# Patient Record
Sex: Female | Born: 1970 | Race: White | Hispanic: No | Smoking: Never smoker
Health system: Southern US, Community
[De-identification: ages and names within clinical notes are randomized; demographics above are authoritative.]

## PROBLEM LIST (undated history)

## (undated) DIAGNOSIS — E559 Vitamin D deficiency, unspecified: Secondary | ICD-10-CM

## (undated) DIAGNOSIS — I10 Essential (primary) hypertension: Secondary | ICD-10-CM

## (undated) DIAGNOSIS — Z9884 Bariatric surgery status: Secondary | ICD-10-CM

## (undated) DIAGNOSIS — D509 Iron deficiency anemia, unspecified: Secondary | ICD-10-CM

## (undated) DIAGNOSIS — F419 Anxiety disorder, unspecified: Secondary | ICD-10-CM

## (undated) DIAGNOSIS — R011 Cardiac murmur, unspecified: Secondary | ICD-10-CM

## (undated) DIAGNOSIS — I359 Nonrheumatic aortic valve disorder, unspecified: Secondary | ICD-10-CM

## (undated) DIAGNOSIS — I517 Cardiomegaly: Secondary | ICD-10-CM

## (undated) DIAGNOSIS — E669 Obesity, unspecified: Secondary | ICD-10-CM

## (undated) HISTORY — DX: Nonrheumatic aortic valve disorder, unspecified: I35.9

## (undated) HISTORY — DX: Vitamin D deficiency, unspecified: E55.9

## (undated) HISTORY — DX: Bariatric surgery status: Z98.84

## (undated) HISTORY — DX: Cardiac murmur, unspecified: R01.1

## (undated) HISTORY — DX: Essential (primary) hypertension: I10

## (undated) HISTORY — DX: Iron deficiency anemia, unspecified: D50.9

## (undated) HISTORY — DX: Cardiomegaly: I51.7

## (undated) HISTORY — DX: Obesity, unspecified: E66.9

## (undated) HISTORY — DX: Anxiety disorder, unspecified: F41.9

---

## 2008-03-03 ENCOUNTER — Ambulatory Visit: Payer: Self-pay | Admitting: Family Medicine

## 2008-03-03 DIAGNOSIS — I10 Essential (primary) hypertension: Secondary | ICD-10-CM | POA: Insufficient documentation

## 2008-03-03 DIAGNOSIS — S6390XA Sprain of unspecified part of unspecified wrist and hand, initial encounter: Secondary | ICD-10-CM | POA: Insufficient documentation

## 2008-03-03 DIAGNOSIS — S6990XA Unspecified injury of unspecified wrist, hand and finger(s), initial encounter: Secondary | ICD-10-CM | POA: Insufficient documentation

## 2010-01-26 HISTORY — PX: GASTRIC BYPASS: SHX52

## 2010-02-04 ENCOUNTER — Ambulatory Visit
Admission: RE | Admit: 2010-02-04 | Discharge: 2010-02-04 | Payer: Self-pay | Source: Home / Self Care | Admitting: Emergency Medicine

## 2010-02-04 ENCOUNTER — Encounter: Payer: Self-pay | Admitting: Emergency Medicine

## 2010-02-04 DIAGNOSIS — S139XXA Sprain of joints and ligaments of unspecified parts of neck, initial encounter: Secondary | ICD-10-CM | POA: Insufficient documentation

## 2010-02-10 ENCOUNTER — Telehealth (INDEPENDENT_AMBULATORY_CARE_PROVIDER_SITE_OTHER): Payer: Self-pay | Admitting: *Deleted

## 2010-02-27 NOTE — Progress Notes (Signed)
  Phone Note Outgoing Call Call back at [facility] Phone [phone]   Call placed by: Lajean Saver RN,  February 10, 2010 4:44 PM Call placed to: Patient Summary of Call: Callback: No answer. Message left with reason for call and call back with any questions or concerns

## 2010-02-27 NOTE — Assessment & Plan Note (Signed)
Summary: SEVERE NECK PAIN/NH   Vital Signs:  Patient Profile:   40 Years Old Female CC:      posterior neck pain x 2 1/2 weeks Height:     67.5 inches Weight:      293 pounds O2 Sat:      99 % O2 treatment:    Room Air Temp:     99.3 degrees F oral Pulse rate:   94 / minute Resp:     20 per minute BP sitting:   155 / 101  (left arm) Cuff size:   large  Pt. in pain?   yes    Location:   posterior neck    Intensity:   7    Type:       dull  Vitals Entered By: Lajean Saver RN (February 04, 2010 2:04 PM)                   Updated Prior Medication List: * HCTZ once a day ULTRACET 37.5-325 MG TABS (TRAMADOL-ACETAMINOPHEN) 1 by mouth q6 hrs as needed for pain FLEXERIL 10 MG TABS (CYCLOBENZAPRINE HCL) 1 by mouth three times a day as needed spasms / pain MULTIVITAMINS  TABS (MULTIPLE VITAMIN) once daily CALCIUM 500 MG TABS (CALCIUM)   Current Allergies: ! SULFAHistory of Present Illness History from: patient Chief Complaint: posterior neck pain x 2 1/2 weeks History of Present Illness: Posterior neck pain for 2 1/2 weeks.  Was moving a bed around the same time that this occurred and may have contributed.  7/10 dull spasms worse with movement.  No HA, visual changes, migraines, F/C/N/V.  Has been experiencing mild R elbow tingling as well but she is an administrative asst and types all day.  No history of neck or back pain.  She states that her office was hot today and that's why her temp is up.  Other than neck and HA, no other symptoms.  REVIEW OF SYSTEMS Constitutional Symptoms      Denies fever, chills, night sweats, weight loss, weight gain, and fatigue.  Eyes       Denies change in vision, eye pain, eye discharge, glasses, contact lenses, and eye surgery. Ear/Nose/Throat/Mouth       Denies hearing loss/aids, change in hearing, ear pain, ear discharge, dizziness, frequent runny nose, frequent nose bleeds, sinus problems, sore throat, hoarseness, and tooth pain or bleeding.   Respiratory       Denies dry cough, productive cough, wheezing, shortness of breath, asthma, bronchitis, and emphysema/COPD.  Cardiovascular       Denies murmurs, chest pain, and tires easily with exhertion.    Gastrointestinal       Denies stomach pain, nausea/vomiting, diarrhea, constipation, blood in bowel movements, and indigestion. Genitourniary       Denies painful urination, kidney stones, and loss of urinary control. Neurological       Complains of tingling.      Denies paralysis, seizures, and fainting/blackouts.      Comments: right elbow Musculoskeletal       Complains of muscle pain and decreased range of motion.      Denies joint pain, joint stiffness, redness, swelling, muscle weakness, and gout.      Comments: neck Skin       Denies bruising, unusual mles/lumps or sores, and hair/skin or nail changes.  Psych       Denies mood changes, temper/anger issues, anxiety/stress, speech problems, depression, and sleep problems. Other Comments: Patient c/o neck pain x 2  1/2 weeks. she c/o some tingling in her right elbow.    Past History:  Past Medical History: fluid problems fluid retention  Past Surgical History: Reviewed history from 03/03/2008 and no changes required. None  Family History: Family History Diabetes 1st degree relative Family History Hypertension mother alive with high blood pressure and diabetes father alive with heart problems brother ailve and healthy sister aive with high blood pressure and kidney CA  Social History: Never Smoked Alcohol use-yes - one drink per week Drug use-no Regular exercise-no Occupation: Advertising account planner Divorced Physical Exam General appearance: well developed, well nourished, no acute distress, pleasant Chest/Lungs: no rales, wheezes, or rhonchi bilateral, breath sounds equal without effort Heart: regular rate and  rhythm, no murmur Extremities: normal extremities Neurological: distal NV status of R arm is  normal Skin: no obvious rashes or lesions MSE: oriented to time, place, and person TTP trapezius upper (to occiput) and middle (to scapula) as well as paraspinal, worse on R.  ROM of neck is full.  Spurling negative.  No meningitic signs, PERRLA, EOMI Assessment New Problems: NECK SPRAIN AND STRAIN (ICD-847.0) CERVICAL MUSCLE STRAIN (ICD-847.0)   Plan New Medications/Changes: FLEXERIL 10 MG TABS (CYCLOBENZAPRINE HCL) 1 by mouth three times a day as needed spasms / pain  #18 x 0, 02/04/2010, Hoyt Koch MD ULTRACET 37.5-325 MG TABS (TRAMADOL-ACETAMINOPHEN) 1 by mouth q6 hrs as needed for pain  #20 x 0, 02/04/2010, Hoyt Koch MD  Planning Comments:   If she is not improving in 1-2 weeks, consider C-spine Xray to look for any OA.  Also will get her to see Physical therapy for stretching.  Advised her to return to her masseuse to see if that helps.  Rx given today for Ultracet and Flexeril.  Go buy a heating pad as well and work on gentle ROM.  If any further problems, fever, f/u with PCP sooner.   The patient and/or caregiver has been counseled thoroughly with regard to medications prescribed including dosage, schedule, interactions, rationale for use, and possible side effects and they verbalize understanding.  Diagnoses and expected course of recovery discussed and will return if not improved as expected or if the condition worsens. Patient and/or caregiver verbalized understanding.  Prescriptions: FLEXERIL 10 MG TABS (CYCLOBENZAPRINE HCL) 1 by mouth three times a day as needed spasms / pain  #18 x 0   Entered and Authorized by:   Hoyt Koch MD   Signed by:   Hoyt Koch MD on 02/04/2010   Method used:   Print then Give to Patient   RxID:   [id number] ULTRACET 37.5-325 MG TABS (TRAMADOL-ACETAMINOPHEN) 1 by mouth q6 hrs as needed for pain  #20 x 0   Entered and Authorized by:   Hoyt Koch MD   Signed by:   Hoyt Koch MD on 02/04/2010   Method  used:   Print then Give to Patient   RxID:   [id number]

## 2011-06-23 ENCOUNTER — Other Ambulatory Visit: Payer: Self-pay | Admitting: Obstetrics & Gynecology

## 2011-06-23 ENCOUNTER — Encounter: Payer: Self-pay | Admitting: Obstetrics & Gynecology

## 2011-06-23 ENCOUNTER — Ambulatory Visit (INDEPENDENT_AMBULATORY_CARE_PROVIDER_SITE_OTHER): Payer: 59 | Admitting: Obstetrics & Gynecology

## 2011-06-23 VITALS — BP 124/84 | HR 82 | Temp 98.0°F | Resp 16 | Ht 66.0 in | Wt 290.0 lb

## 2011-06-23 DIAGNOSIS — Z124 Encounter for screening for malignant neoplasm of cervix: Secondary | ICD-10-CM

## 2011-06-23 DIAGNOSIS — Z113 Encounter for screening for infections with a predominantly sexual mode of transmission: Secondary | ICD-10-CM

## 2011-06-23 DIAGNOSIS — Z1151 Encounter for screening for human papillomavirus (HPV): Secondary | ICD-10-CM

## 2011-06-23 DIAGNOSIS — Z0000 Encounter for general adult medical examination without abnormal findings: Secondary | ICD-10-CM

## 2011-06-23 DIAGNOSIS — Z01419 Encounter for gynecological examination (general) (routine) without abnormal findings: Secondary | ICD-10-CM

## 2011-06-23 MED ORDER — ETONOGESTREL-ETHINYL ESTRADIOL 0.12-0.015 MG/24HR VA RING: VAGINAL_RING | VAGINAL | Status: DC

## 2011-06-23 NOTE — Progress Notes (Signed)
Patient is here for routine exam, she is having heavy cycles and would like to start on birth control.  She is also having increased vaginal dryness and libido issues she would like to discuss.

## 2011-06-24 ENCOUNTER — Encounter: Payer: Self-pay | Admitting: Obstetrics & Gynecology

## 2011-06-24 NOTE — Progress Notes (Signed)
  Subjective:     Janet Wheeler is a 41 y.o. female here for a routine exam.  Current complaints: vaginal dryness / irritation; decreased libido.  Personal health questionnaire reviewed: yes.  Pt has decreased libido and wonders if it is related to gastric bypass.  Pt has new partner in past year and relationship is good.  Pt is more stressed at work.  Pt uses [place] but not replens for vaginal moisture.  [name] is also concerned about heavy menses.  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  Her menses is q 28 days and heavy for 3-4 days and lasts a total of 7.  Discussed OCPs vs Nuva Ring.  There is not good evidence about efficacy of OCPs after gastric bypass.  Most studies recommend barrier method after gastric bypass.  Nuva Ring would be a better option.   Gynecologic History Patient's last menstrual period was 05/31/2011. Contraception: condoms Last Pap: 2012. Results were: normal Last mammogram: Needs to be ordered  Obstetric History OB History    Grav Para Term Preterm Abortions TAB SAB Ect Mult Living   0 0 0 0 0 0 0 0 0 0        The following portions of the patient's history were reviewed and updated as appropriate: allergies, current medications, past family history, past medical history, past social history, past surgical history and problem list.  Review of Systems A comprehensive review of systems was negative except for: Genitourinary: positive for sexual problems, vaginal discharge and heavy menses    Objective:    Vitals:  WNL General appearance: alert, cooperative and no distress Head: Normocephalic, without obvious abnormality, atraumatic Eyes: negative Throat: lips, mucosa, and tongue normal; teeth and gums normal Lungs: clear to auscultation bilaterally Breasts: normal appearance, no masses or tenderness, No nipple retraction or dimpling, No nipple discharge or bleeding Heart: regular rate and rhythm Abdomen: soft, non-tender; bowel sounds normal; no masses,  no  organomegaly Pelvic: cervix normal in appearance, external genitalia normal, no adnexal masses or tenderness, no bladder tenderness, no cervical motion tenderness, perianal skin: no external genital warts noted, rectovaginal septum normal, urethra without abnormality or discharge, uterus normal size, shape, and consistency and vagina normal without discharge Extremities: no edema, redness or tenderness in the calves or thighs Skin: no lesions or rash Lymph nodes: Axillary adenopathy: none       Assessment:    Healthy female exam.    Plan:    Education reviewed: safe sex/STD prevention, self breast exams and pap. Contraception: condoms. Mammogram ordered. Follow up in: 1 year. pap smear, wet prep, GC/Chlam   Nuva Ring

## 2011-06-25 ENCOUNTER — Other Ambulatory Visit: Payer: Self-pay | Admitting: Obstetrics & Gynecology

## 2011-06-25 LAB — WET PREP, GENITAL: Yeast Wet Prep HPF POC: NONE SEEN

## 2011-06-25 MED ORDER — METRONIDAZOLE 0.75 % VA GEL: 1.0000 | Freq: Two times a day (BID) | VAGINAL | Status: AC

## 2011-06-26 NOTE — Progress Notes (Signed)
Pt notified of Wet Prep results and that a RX was sent to [facility] in [place].

## 2011-07-02 ENCOUNTER — Telehealth: Payer: Self-pay | Admitting: *Deleted

## 2011-07-02 NOTE — Telephone Encounter (Signed)
Message copied by Gerome Apley on Thu Jul 02, 2011 10:11 AM ------      Message from: Lesly Dukes      Created: Thu Jul 02, 2011  9:22 AM       Pap and HPV negative--paps q 3 years

## 2011-07-21 ENCOUNTER — Ambulatory Visit
Admission: RE | Admit: 2011-07-21 | Discharge: 2011-07-21 | Disposition: A | Discharge status: Home / Self Care | Payer: 59 | Source: Ambulatory Visit | Attending: Obstetrics & Gynecology | Admitting: Obstetrics & Gynecology

## 2011-07-21 DIAGNOSIS — Z01419 Encounter for gynecological examination (general) (routine) without abnormal findings: Secondary | ICD-10-CM

## 2011-09-01 ENCOUNTER — Encounter: Payer: Self-pay | Admitting: *Deleted

## 2011-09-01 ENCOUNTER — Emergency Department (INDEPENDENT_AMBULATORY_CARE_PROVIDER_SITE_OTHER): Payer: BC Managed Care – PPO

## 2011-09-01 ENCOUNTER — Emergency Department
Admission: EM | Admit: 2011-09-01 | Discharge: 2011-09-01 | Disposition: A | Discharge status: Home / Self Care | Payer: BC Managed Care – PPO | Source: Home / Self Care

## 2011-09-01 DIAGNOSIS — W19XXXA Unspecified fall, initial encounter: Secondary | ICD-10-CM

## 2011-09-01 DIAGNOSIS — M79609 Pain in unspecified limb: Secondary | ICD-10-CM

## 2011-09-01 DIAGNOSIS — M25529 Pain in unspecified elbow: Secondary | ICD-10-CM

## 2011-09-01 DIAGNOSIS — S56919A Strain of unspecified muscles, fascia and tendons at forearm level, unspecified arm, initial encounter: Secondary | ICD-10-CM

## 2011-09-01 DIAGNOSIS — IMO0002 Reserved for concepts with insufficient information to code with codable children: Secondary | ICD-10-CM

## 2011-09-01 NOTE — ED Provider Notes (Signed)
History     CSN: [id number]  Arrival date & time 09/01/11  1742   None     Chief Complaint  Patient presents with  . Arm Injury   HPI Arm pain;  Pt was coming downstairs, slipped.  Pt grabbed rail with L hand, weight of fall fell primarily on R hand. Incident occurred this am.  Pt has had persisent R forearm pain since this point.  No wrist or elbow pain.  Pain primarily on anterior forearm.  Pain primarily with grip and wrist rotation.  No distal numbness or paresthesias.   History reviewed. No pertinent past medical history.  Past Surgical History  Procedure Date  . Gastric bypass 2012    Family History  Problem Relation Age of Onset  . Diabetes Mother   . Heart disease Mother   . Heart disease Father   . Diabetes Father   . Cancer Father     prostate   . Heart disease Sister   . Cancer Sister     renal and thyroid cancer    History  Substance Use Topics  . Smoking status: Never Smoker   . Smokeless tobacco: Never Used  . Alcohol Use: Yes     wine occasionally/ once a week    OB History    Grav Para Term Preterm Abortions TAB SAB Ect Mult Living   0 0 0 0 0 0 0 0 0 0       Review of Systems  All other systems reviewed and are negative.    Allergies  Sulfonamide derivatives and Sulfur  Home Medications   Current Outpatient Rx  Name Route Sig Dispense Refill  . OMEPRAZOLE 20 MG PO CPDR Oral Take 20 mg by mouth daily.    Marland Kitchen VITAMIN B 12 PO Oral Take 1,000 mg by mouth.    . ETONOGESTREL-ETHINYL ESTRADIOL 0.12-0.015 MG/24HR VA RING  Insert vaginally and leave in place for 3 consecutive weeks, then remove for 1 week. 1 each 12  . MULTI-VITAMIN/MINERALS PO TABS Oral Take 1 tablet by mouth daily.    Marland Kitchen PANTOPRAZOLE SODIUM 40 MG PO TBEC      . VITAMIN D (ERGOCALCIFEROL) 50000 UNITS PO CAPS        BP 146/91  Pulse 58  Temp 99.1 F (37.3 C) (Oral)  Resp 16  Ht 5\' 6"  (1.676 m)  Wt 175 lb (79.379 kg)  BMI 28.25 kg/m2  SpO2 100%  LMP  08/30/2011  Physical Exam  Constitutional: She appears well-developed and well-nourished.  HENT:  Head: Normocephalic and atraumatic.  Eyes: Pupils are equal, round, and reactive to light.  Neck: Normal range of motion. Neck supple.  Cardiovascular: Normal rate and regular rhythm.   Pulmonary/Chest: Effort normal and breath sounds normal.  Abdominal: Soft.  Musculoskeletal:       Arms:   ED Course  Procedures (including critical care time)  Labs Reviewed - No data to display Dg Forearm Right  09/01/2011  *RADIOLOGY REPORT*  Clinical Data: Pain secondary to a fall today.  RIGHT FOREARM - 2 VIEW  Comparison: None.  Findings: There is no fracture, dislocation, or joint effusion. The patient has slight degenerative changes between the olecranon and the trochlea of the distal humerus.  IMPRESSION: No acute osseous abnormality.  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  Original Report Authenticated By: Gwynn Burly, M.D.     1. Forearm strain       MDM  Likely strain of pronators and extensors of  anterior forearm. Xrays negative for dislocation or fracture.  RICE, NSAIDs.  Discussed general care.  Handout given.  Follow up as needed.     The patient and/or caregiver has been counseled thoroughly with regard to treatment plan and/or medications prescribed including dosage, schedule, interactions, rationale for use, and possible side effects and they verbalize understanding. Diagnoses and expected course of recovery discussed and will return if not improved as expected or if the condition worsens. Patient and/or caregiver verbalized understanding.            Floydene Flock, MD 09/01/11 [id number]

## 2011-09-01 NOTE — ED Notes (Signed)
Pt c/o RT arm injury  X today, after falling on her steps at home. No OTC meds.

## 2011-09-02 NOTE — ED Provider Notes (Signed)
Agree with exam, assessment, and plan.   Janet Wheeler A Lenna Hagarty, MD 09/02/11 0952 

## 2012-05-31 ENCOUNTER — Other Ambulatory Visit: Payer: Self-pay | Admitting: *Deleted

## 2012-05-31 DIAGNOSIS — IMO0001 Reserved for inherently not codable concepts without codable children: Secondary | ICD-10-CM

## 2012-05-31 MED ORDER — ETONOGESTREL-ETHINYL ESTRADIOL 0.12-0.015 MG/24HR VA RING: VAGINAL_RING | VAGINAL | Status: DC

## 2012-05-31 NOTE — Telephone Encounter (Signed)
Rcvd refill request for Nuvaring - pt has not been seen by [facility] in about a year - refilled one month supply with note to pharm that pt needs appt before any further refills

## 2012-06-09 ENCOUNTER — Ambulatory Visit (INDEPENDENT_AMBULATORY_CARE_PROVIDER_SITE_OTHER): Payer: BC Managed Care – PPO | Admitting: Obstetrics & Gynecology

## 2012-06-09 ENCOUNTER — Encounter: Payer: Self-pay | Admitting: Obstetrics & Gynecology

## 2012-06-09 VITALS — BP 125/88 | HR 84 | Resp 16 | Ht 67.0 in | Wt 191.0 lb

## 2012-06-09 DIAGNOSIS — Z01419 Encounter for gynecological examination (general) (routine) without abnormal findings: Secondary | ICD-10-CM

## 2012-06-09 DIAGNOSIS — Z0000 Encounter for general adult medical examination without abnormal findings: Secondary | ICD-10-CM

## 2012-06-09 NOTE — Progress Notes (Signed)
  Subjective:     Janet Wheeler is a 42 y.o. female here for a routine exam.  Current complaints: none.  Doing well on Nuva Ring.  Vaginal dryness is better.  Pt planning a 360 abdominoplasty.  Pt also training for 1/2 marathon.  Personal health questionnaire reviewed: yes.   Gynecologic History Patient's last menstrual period was 05/01/2012. Contraception: NuvaRing vaginal inserts Last Pap: 2013. Results were: normal, HPV negative Last mammogram: 2013. Results were: normal  Obstetric History OB History   Grav Para Term Preterm Abortions TAB SAB Ect Mult Living   0 0 0 0 0 0 0 0 0 0        The following portions of the patient's history were reviewed and updated as appropriate: allergies, current medications, past family history, past medical history, past social history, past surgical history and problem list.  Review of Systems Pertinent items are noted in HPI.    Objective:   Filed Vitals:   06/09/12 1359  BP: 125/88  Pulse: 84  Resp: 16  Height: 5\' 7"  (1.702 m)  Weight: 191 lb (86.637 kg)      Vitals:  WNL General appearance: alert, cooperative and no distress Head: Normocephalic, without obvious abnormality, atraumatic Eyes: negative Throat: lips, mucosa, and tongue normal; teeth and gums normal Lungs: clear to auscultation bilaterally Breasts: normal appearance, no masses or tenderness, No nipple retraction or dimpling, No nipple discharge or bleeding Heart: regular rate and rhythm Abdomen: soft, non-tender; bowel sounds normal; no masses,  no organomegaly Pelvic: cervix normal in appearance, external genitalia normal, no adnexal masses or tenderness, no bladder tenderness, no cervical motion tenderness, perianal skin: no external genital warts noted, rectovaginal septum normal, urethra without abnormality or discharge, uterus normal size, shape, and consistency and vagina normal without discharge Extremities: no edema, redness or tenderness in the calves or  thighs Skin: no lesions or rash Lymph nodes: Axillary adenopathy: none        Assessment:    Healthy female exam.    Plan:    Education reviewed: self breast exams, skin cancer screening and weight bearing exercise. Contraception: NuvaRing vaginal inserts. Mammogram ordered. Follow up in: 1 year. Pap in 2016

## 2012-07-26 ENCOUNTER — Ambulatory Visit (INDEPENDENT_AMBULATORY_CARE_PROVIDER_SITE_OTHER): Payer: BC Managed Care – PPO

## 2012-07-26 ENCOUNTER — Other Ambulatory Visit: Payer: Self-pay | Admitting: *Deleted

## 2012-07-26 DIAGNOSIS — Z1231 Encounter for screening mammogram for malignant neoplasm of breast: Secondary | ICD-10-CM

## 2012-07-26 DIAGNOSIS — IMO0001 Reserved for inherently not codable concepts without codable children: Secondary | ICD-10-CM

## 2012-07-26 DIAGNOSIS — Z0000 Encounter for general adult medical examination without abnormal findings: Secondary | ICD-10-CM

## 2012-07-26 MED ORDER — ETONOGESTREL-ETHINYL ESTRADIOL 0.12-0.015 MG/24HR VA RING: VAGINAL_RING | VAGINAL | Status: DC

## 2012-07-26 NOTE — Telephone Encounter (Signed)
Pt called and stated that she never received her RF on Nuvaring last month.  Per Dr Bertram Denver note she gave her RF's x 1 year.  Pharmacy notified to [facility] RF.

## 2013-06-20 ENCOUNTER — Other Ambulatory Visit: Payer: Self-pay | Admitting: *Deleted

## 2013-06-20 DIAGNOSIS — IMO0001 Reserved for inherently not codable concepts without codable children: Secondary | ICD-10-CM

## 2013-06-20 MED ORDER — ETONOGESTREL-ETHINYL ESTRADIOL 0.12-0.015 MG/24HR VA RING: VAGINAL_RING | VAGINAL | Status: DC

## 2013-07-04 ENCOUNTER — Ambulatory Visit (INDEPENDENT_AMBULATORY_CARE_PROVIDER_SITE_OTHER): Payer: BC Managed Care – PPO | Admitting: Obstetrics & Gynecology

## 2013-07-04 ENCOUNTER — Encounter: Payer: Self-pay | Admitting: Obstetrics & Gynecology

## 2013-07-04 VITALS — BP 128/80 | HR 75 | Resp 16 | Ht 67.0 in | Wt 192.0 lb

## 2013-07-04 DIAGNOSIS — Z309 Encounter for contraceptive management, unspecified: Secondary | ICD-10-CM

## 2013-07-04 DIAGNOSIS — Z01419 Encounter for gynecological examination (general) (routine) without abnormal findings: Secondary | ICD-10-CM

## 2013-07-04 DIAGNOSIS — IMO0001 Reserved for inherently not codable concepts without codable children: Secondary | ICD-10-CM

## 2013-07-04 MED ORDER — ETONOGESTREL-ETHINYL ESTRADIOL 0.12-0.015 MG/24HR VA RING: VAGINAL_RING | VAGINAL | Status: DC

## 2013-07-04 NOTE — Progress Notes (Signed)
  Subjective:     Janet Wheeler is a 43 y.o. female here for a routine exam.  Current complaints: none; planning on breast augmentation and 360 abdominoplasty this winter (near Christmas).  Needs ammogram.  Personal health questionnaire reviewed: yes.   Gynecologic History Patient's last menstrual period was 06/22/2013. Contraception: NuvaRing vaginal inserts Last Pap: 2013. Results were: normal (negative cotesting) Last mammogram: 2014. Results were: normal  Obstetric History OB History  Gravida Para Term Preterm AB SAB TAB Ectopic Multiple Living  0 0 0 0 0 0 0 0 0 0          The following portions of the patient's history were reviewed and updated as appropriate: allergies, current medications, past family history, past medical history, past social history, past surgical history and problem list.  Review of Systems Pertinent items are noted in HPI.    Objective:      Filed Vitals:   07/04/13 1415  BP: 128/80  Pulse: 75  Resp: 16  Height: 5\' 7"  (1.702 m)  Weight: 192 lb (87.091 kg)   Vitals:  WNL General appearance: alert, cooperative and no distress Head: Normocephalic, without obvious abnormality, atraumatic Eyes: negative Throat: lips, mucosa, and tongue normal; teeth and gums normal Lungs: clear to auscultation bilaterally Breasts: normal appearance, no masses or tenderness, No nipple retraction or dimpling, No nipple discharge or bleeding Heart: regular rate and rhythm Abdomen: soft, non-tender; bowel sounds normal; no masses,  no organomegaly  Pelvic:  External Genitalia:  Tanner V, no lesion Urethra:  No prolapse Vagina:  Pink, normal rugae, no blood or discharge Cervix:  No CMT, no lesion Uterus:  Normal size and contour, non tender Adnexa:  Normal, no masses, non tender Rectovaginal Septum:  Non tender, no masses  Extremities: no edema, redness or tenderness in the calves or thighs Skin: no lesions or rash Lymph nodes: Axillary adenopathy: none        Assessment:    Healthy female exam.    Plan:    Education reviewed: safe sex/STD prevention, self breast exams and weight bearing exercise. Contraception: NuvaRing vaginal inserts. Mammogram ordered. Follow up in: 1 year. Pap due next year.

## 2013-07-27 ENCOUNTER — Other Ambulatory Visit: Payer: Self-pay | Admitting: Obstetrics & Gynecology

## 2013-07-27 ENCOUNTER — Ambulatory Visit (INDEPENDENT_AMBULATORY_CARE_PROVIDER_SITE_OTHER): Payer: BC Managed Care – PPO

## 2013-07-27 DIAGNOSIS — Z1231 Encounter for screening mammogram for malignant neoplasm of breast: Secondary | ICD-10-CM

## 2014-06-26 ENCOUNTER — Other Ambulatory Visit: Payer: Self-pay | Admitting: Obstetrics & Gynecology

## 2014-06-26 DIAGNOSIS — Z1231 Encounter for screening mammogram for malignant neoplasm of breast: Secondary | ICD-10-CM

## 2014-07-05 ENCOUNTER — Other Ambulatory Visit: Payer: Self-pay | Admitting: Obstetrics & Gynecology

## 2014-07-05 NOTE — Telephone Encounter (Signed)
Patient needs to come in for annual exam.

## 2014-07-26 ENCOUNTER — Ambulatory Visit (INDEPENDENT_AMBULATORY_CARE_PROVIDER_SITE_OTHER): Payer: 59 | Admitting: Obstetrics & Gynecology

## 2014-07-26 ENCOUNTER — Encounter: Payer: Self-pay | Admitting: Obstetrics & Gynecology

## 2014-07-26 VITALS — BP 129/88 | HR 68 | Ht 66.0 in | Wt 193.0 lb

## 2014-07-26 DIAGNOSIS — Z01419 Encounter for gynecological examination (general) (routine) without abnormal findings: Secondary | ICD-10-CM

## 2014-07-26 DIAGNOSIS — Z124 Encounter for screening for malignant neoplasm of cervix: Secondary | ICD-10-CM

## 2014-07-26 DIAGNOSIS — Z139 Encounter for screening, unspecified: Secondary | ICD-10-CM

## 2014-07-26 DIAGNOSIS — Z3049 Encounter for surveillance of other contraceptives: Secondary | ICD-10-CM | POA: Diagnosis not present

## 2014-07-26 DIAGNOSIS — Z1151 Encounter for screening for human papillomavirus (HPV): Secondary | ICD-10-CM | POA: Diagnosis not present

## 2014-07-26 LAB — COMPREHENSIVE METABOLIC PANEL
ALT: <8 U/L (ref 0–35)
AST: 12 U/L (ref 0–37)
Albumin: 3.7 g/dL (ref 3.5–5.2)
Alkaline Phosphatase: 36 U/L — ABNORMAL LOW (ref 39–117)
BUN: 10 mg/dL (ref 6–23)
CO2: 20 mEq/L (ref 19–32)
CREATININE: 0.74 mg/dL (ref 0.50–1.10)
Calcium: 8.6 mg/dL (ref 8.4–10.5)
Chloride: 109 mEq/L (ref 96–112)
GLUCOSE: 88 mg/dL (ref 70–99)
POTASSIUM: 3.7 meq/L (ref 3.5–5.3)
SODIUM: 141 meq/L (ref 135–145)
Total Bilirubin: 0.3 mg/dL (ref 0.2–1.2)
Total Protein: 6.1 g/dL (ref 6.0–8.3)

## 2014-07-26 LAB — FERRITIN: Ferritin: 3 ng/mL — ABNORMAL LOW (ref 10–291)

## 2014-07-26 LAB — VITAMIN B12: VITAMIN B 12: 888 pg/mL (ref 211–911)

## 2014-07-26 LAB — CBC
HCT: 31 % — ABNORMAL LOW (ref 36.0–46.0)
Hemoglobin: 9.2 g/dL — ABNORMAL LOW (ref 12.0–15.0)
MCH: 19.8 pg — AB (ref 26.0–34.0)
MCHC: 29.7 g/dL — AB (ref 30.0–36.0)
MCV: 66.8 fL — AB (ref 78.0–100.0)
MPV: 9.8 fL (ref 8.6–12.4)
PLATELETS: 402 10*3/uL — AB (ref 150–400)
RBC: 4.64 MIL/uL (ref 3.87–5.11)
RDW: 17 % — ABNORMAL HIGH (ref 11.5–15.5)
WBC: 7.7 10*3/uL (ref 4.0–10.5)

## 2014-07-26 LAB — LIPID PANEL
CHOL/HDL RATIO: 2.8 ratio
Cholesterol: 170 mg/dL (ref 0–200)
HDL: 61 mg/dL (ref >=46)
LDL CALC: 80 mg/dL (ref 0–99)
TRIGLYCERIDES: 145 mg/dL (ref <150)
VLDL: 29 mg/dL (ref 0–40)

## 2014-07-26 LAB — TSH: TSH: 1.686 u[IU]/mL (ref 0.350–4.500)

## 2014-07-26 MED ORDER — ETONOGESTREL-ETHINYL ESTRADIOL 0.12-0.015 MG/24HR VA RING: VAGINAL_RING | VAGINAL | Status: DC

## 2014-07-26 NOTE — Progress Notes (Signed)
  Subjective:     Janet Wheeler is a 44 y.o. female here for a routine exam.  Current complaints: none.  She did not get the plastic surgery done but continues to work out.  Gynecologic History Patient's last menstrual period was 07/07/2014. Contraception: NuvaRing vaginal inserts Last Pap: 2013. Results were: normal Last mammogram: 2015. Results were: normal  Obstetric History OB History  Gravida Para Term Preterm AB SAB TAB Ectopic Multiple Living  0 0 0 0 0 0 0 0 0 0          The following portions of the patient's history were reviewed and updated as appropriate: allergies, current medications, past family history, past medical history, past social history, past surgical history and problem list.  Review of Systems Pertinent items are noted in HPI.   ROs is negative.   Objective:      Filed Vitals:   07/26/14 0826  BP: 129/88  Pulse: 68  Height: 5\' 6"  (1.676 m)  Weight: 193 lb (87.544 kg)   Vitals:  WNL General appearance: alert, cooperative and no distress Head: Normocephalic, without obvious abnormality, atraumatic Eyes: negative Throat: lips, mucosa, and tongue normal; teeth and gums normal Lungs: clear to auscultation bilaterally Breasts: normal appearance, no masses or tenderness, No nipple retraction or dimpling, No nipple discharge or bleeding Heart: regular rate and rhythm Abdomen: soft, non-tender; bowel sounds normal; no masses,  no organomegaly  Pelvic:  External Genitalia:  Tanner V, no lesion Urethra:  No prolpase Vagina:  Pink, normal rugae, no blood or discharge Cervix:  No CMT, no lesion Uterus:  Normal size and contour, non tender Adnexa:  Normal, no masses, non tender  Extremities: no edema, redness or tenderness in the calves or thighs Skin: no lesions or rash Lymph nodes: Axillary adenopathy: none         Assessment:    Healthy female exam.    Plan:    Education reviewed: self breast exams and skin cancer  screening. Contraception: NuvaRing vaginal inserts. Mammogram ordered. Follow up in: 1 year.

## 2014-07-27 ENCOUNTER — Telehealth: Payer: Self-pay

## 2014-07-27 ENCOUNTER — Encounter: Payer: Self-pay | Admitting: Obstetrics & Gynecology

## 2014-07-27 DIAGNOSIS — D509 Iron deficiency anemia, unspecified: Secondary | ICD-10-CM

## 2014-07-27 DIAGNOSIS — E559 Vitamin D deficiency, unspecified: Secondary | ICD-10-CM

## 2014-07-27 HISTORY — DX: Vitamin D deficiency, unspecified: E55.9

## 2014-07-27 HISTORY — DX: Iron deficiency anemia, unspecified: D50.9

## 2014-07-27 LAB — VITAMIN D 25 HYDROXY (VIT D DEFICIENCY, FRACTURES): Vit D, 25-Hydroxy: 29 ng/mL — ABNORMAL LOW (ref 30–100)

## 2014-07-27 LAB — CYTOLOGY - PAP

## 2014-07-27 NOTE — Telephone Encounter (Signed)
Patient called and made aware of results of vitamin D and Ferritin are low. Patient states she will go onto my chart and print off the records of the labs and take them with her to both her PCP and surgeon appointment. Armandina StammerJennifer Howard RN BSN

## 2014-07-27 NOTE — Telephone Encounter (Signed)
-----   Message from Lesly DukesKelly H Leggett, MD sent at 07/27/2014  6:22 AM EDT ----- Vit D and ferritin are low (patient has iron def anemia).  She needs to f/u with her PCP or bypass surgeon for supplementation.  We drew these labs for her in anticipation of her upcoming visit with her surgeon.  Please make sure she knows the values and is getting the appropriate follow up.

## 2014-08-01 LAB — FOLATE RBC

## 2014-08-15 ENCOUNTER — Ambulatory Visit: Payer: Self-pay

## 2014-08-24 ENCOUNTER — Other Ambulatory Visit: Payer: Self-pay | Admitting: Obstetrics & Gynecology

## 2014-08-24 NOTE — Telephone Encounter (Signed)
12 refills given

## 2014-08-30 ENCOUNTER — Ambulatory Visit: Payer: Self-pay

## 2014-09-05 ENCOUNTER — Ambulatory Visit (INDEPENDENT_AMBULATORY_CARE_PROVIDER_SITE_OTHER): Payer: 59

## 2014-09-05 DIAGNOSIS — Z1231 Encounter for screening mammogram for malignant neoplasm of breast: Secondary | ICD-10-CM | POA: Diagnosis not present

## 2015-09-03 ENCOUNTER — Other Ambulatory Visit: Payer: Self-pay

## 2015-09-03 ENCOUNTER — Telehealth: Payer: Self-pay

## 2015-09-03 DIAGNOSIS — Z3040 Encounter for surveillance of contraceptives, unspecified: Secondary | ICD-10-CM

## 2015-09-03 MED ORDER — ETONOGESTREL-ETHINYL ESTRADIOL 0.12-0.015 MG/24HR VA RING: 1.0000 | VAGINAL_RING | VAGINAL | 0 refills | Status: DC

## 2015-09-03 NOTE — Telephone Encounter (Signed)
Refill Nuvaring.

## 2015-09-03 NOTE — Telephone Encounter (Signed)
Refill request for Nuvaring, sent to pharmacy. Pt has an appointment with Dr.Leggett for annual.

## 2015-09-19 ENCOUNTER — Ambulatory Visit (INDEPENDENT_AMBULATORY_CARE_PROVIDER_SITE_OTHER): Payer: No Typology Code available for payment source | Admitting: Obstetrics & Gynecology

## 2015-09-19 ENCOUNTER — Encounter: Payer: Self-pay | Admitting: Obstetrics & Gynecology

## 2015-09-19 VITALS — BP 142/92 | HR 71 | Ht 65.0 in | Wt 198.0 lb

## 2015-09-19 DIAGNOSIS — Z01419 Encounter for gynecological examination (general) (routine) without abnormal findings: Secondary | ICD-10-CM

## 2015-09-20 NOTE — Progress Notes (Signed)
Subjective:     Janet FontanaJulia Wheeler is a 45 y.o. female here for a routine exam.  Current complaints: bleeding (light spotting) during week three of the nuva ring.  Pt takes nuva ring out, then starts heavier flow 2-3 days later.  Otherwise no other bleeding .     Gynecologic History Patient's last menstrual period was 09/08/2015 (exact date). Contraception: NuvaRing vaginal inserts Last Pap: 06/2014. Results were: normal Last mammogram: 2016. Results were: normal  Obstetric History OB History  Gravida Para Term Preterm AB Living  0 0 0 0 0 0  SAB TAB Ectopic Multiple Live Births  0 0 0 0           The following portions of the patient's history were reviewed and updated as appropriate: allergies, current medications, past family history, past medical history, past social history, past surgical history and problem list.  Review of Systems Pertinent items noted in HPI and remainder of comprehensive ROS otherwise negative.    Objective:      Vitals:   09/19/15 1308  BP: (!) 142/92  Pulse: 71  Weight: 198 lb (89.8 kg)  Height: 5\' 5"  (1.651 m)   Vitals:  WNL General appearance: alert, cooperative and no distress  HEENT: Normocephalic, without obvious abnormality, atraumatic Eyes: negative Throat: lips, mucosa, and tongue normal; teeth and gums normal  Respiratory: Clear to auscultation bilaterally  CV: Regular rate and rhythm  Breasts:  Normal appearance, no masses or tenderness, no nipple retraction or dimpling  GI: Soft, non-tender; bowel sounds normal; no masses,  no organomegaly  GU: External Genitalia:  Tanner V, no lesion Urethra:  No prolapse   Vagina: Pink, normal rugae, no blood or discharge  Cervix: No CMT, no lesion  Uterus:  Normal size and contour, non tender  Adnexa: Normal, no masses, non tender  Musculoskeletal: No edema, redness or tenderness in the calves or thighs  Skin: No lesions or rash  Lymphatic: Axillary adenopathy: none    Psychiatric: Normal mood  and behavior        Assessment:    Healthy female exam.    Plan:    Contraception: NuvaRing vaginal inserts. Mammogram ordered. Follow up in: 3 months.     BP mildly high.  Will recheck at next visit.   Pt to keep Nuva ring in during spotting and report back what happens.  If spotting continues, can change to another method.  If HTN remains, pt not candidate for methods with estrogen.   Pt opts for mammogram every other year Pap is q3 yrs

## 2015-10-01 ENCOUNTER — Telehealth: Payer: Self-pay | Admitting: *Deleted

## 2015-10-01 NOTE — Telephone Encounter (Signed)
Spoke with patient concerning her slightly elevated BP @ her last visit here.  She had nurse @ her work check her BP and reported it to be 130/72.  Pt states that she was in a hurry the day she saw Dr Penne LashLeggett and felt like that was the reason her BP was elevated.

## 2015-10-14 ENCOUNTER — Telehealth: Payer: Self-pay | Admitting: *Deleted

## 2015-10-14 DIAGNOSIS — Z3040 Encounter for surveillance of contraceptives, unspecified: Secondary | ICD-10-CM

## 2015-10-14 MED ORDER — ETONOGESTREL-ETHINYL ESTRADIOL 0.12-0.015 MG/24HR VA RING: 1.0000 | VAGINAL_RING | VAGINAL | 6 refills | Status: DC

## 2015-10-14 NOTE — Telephone Encounter (Signed)
RF for Nuvaring sent to [facility].  Pt was seen for well woman 8/17

## 2016-06-03 ENCOUNTER — Ambulatory Visit (INDEPENDENT_AMBULATORY_CARE_PROVIDER_SITE_OTHER): Payer: 59 | Admitting: Physician Assistant

## 2016-06-03 ENCOUNTER — Encounter: Payer: Self-pay | Admitting: Physician Assistant

## 2016-06-03 VITALS — BP 149/90 | HR 73 | Ht 65.0 in | Wt 205.0 lb

## 2016-06-03 DIAGNOSIS — F4321 Adjustment disorder with depressed mood: Secondary | ICD-10-CM | POA: Diagnosis not present

## 2016-06-03 DIAGNOSIS — Z7689 Persons encountering health services in other specified circumstances: Secondary | ICD-10-CM | POA: Diagnosis not present

## 2016-06-03 DIAGNOSIS — Z9884 Bariatric surgery status: Secondary | ICD-10-CM | POA: Insufficient documentation

## 2016-06-03 DIAGNOSIS — R030 Elevated blood-pressure reading, without diagnosis of hypertension: Secondary | ICD-10-CM | POA: Diagnosis not present

## 2016-06-03 DIAGNOSIS — R011 Cardiac murmur, unspecified: Secondary | ICD-10-CM | POA: Diagnosis not present

## 2016-06-03 HISTORY — DX: Bariatric surgery status: Z98.84

## 2016-06-03 MED ORDER — SERTRALINE HCL 25 MG PO TABS: 25.0000 mg | ORAL_TABLET | Freq: Every day | ORAL | 3 refills | Status: DC

## 2016-06-03 MED ORDER — TRAZODONE HCL 50 MG PO TABS: 50.0000 mg | ORAL_TABLET | Freq: Every day | ORAL | 3 refills | Status: DC
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
## 2016-06-03 NOTE — Patient Instructions (Addendum)
For your blood pressure: - Check blood pressure at home for the next 2 weeks - Check around the same time each day in a relaxed setting (rest for at least 3 minutes, legs uncrossed, arm resting on a table at level of your heart) - Limit salt. Follow DASH eating plan - Follow-up in 2 weeks  For mood/sleep - Take Sertraline (Zoloft) 1 tablet daily - Take Trazodone 1/2 - 1 tablet 1 hour prior to bedtime as needed for sleep. Ensure you have at least 8 hours for sleep. - You will get a phone call to schedule an appointment with Behavioral health downstairs for counseling - Follow-up in 4 weeks   Sleep Hygiene . Limiting daytime naps to 30 minutes . Napping does not make up for inadequate nighttime sleep. However, a short nap of 20-30 minutes can help to improve mood, alertness and performance.  . Avoiding stimulants such as  caffeine and nicotine close to bedtime.  And when it comes to alcohol, moderation is key 4. While alcohol is well-known to help you fall asleep faster, too much close to bedtime can disrupt sleep in the second half of the night as the body begins to process the alcohol.    . Exercising to promote good quality sleep.  As little as 10 minutes of aerobic exercise, such as walking or cycling, can drastically improve nighttime sleep quality.  For the best night's sleep, most people should avoid strenuous workouts close to bedtime. However, the effect of intense nighttime exercise on sleep differs from person to person, so find out what works best for you.   . Steering clear of food that can be disruptive right before sleep.   Heavy or rich foods, fatty or fried meals, spicy dishes, citrus fruits, and carbonated drinks can trigger indigestion for some people. When this occurs close to bedtime, it can lead to painful heartburn that disrupts sleep. . Ensuring adequate exposure to natural light.  This is particularly important for individuals who may not venture outside frequently. Exposure  to sunlight during the day, as well as darkness at night, helps to maintain a healthy sleep-wake cycle . Marland Kitchen. Establishing a regular relaxing bedtime routine.  A regular nightly routine helps the body recognize that it is bedtime. This could include taking warm shower or bath, reading a book, or light stretches. When possible, try to avoid emotionally upsetting conversations and activities before attempting to sleep. . Making sure that the sleep environment is pleasant.  Mattress and pillows should be comfortable. The bedroom should be cool - between 60 and 67 degrees - for optimal sleep. Bright light from lamps, cell phone and TV screens can make it difficult to fall asleep4, so turn those light off or adjust them when possible. Consider using blackout curtains, eye shades, ear plugs, "white noise" machines, humidifiers, fans and other devices that can make the bedroom more relaxing.   Dysphoria Dysphoria is a condition in which a person feels unpleasant or uncomfortable. It may involve mood changes and feelings of sadness, anxiety, irritability, and restlessness. Dysphoria is often caused by normal life stress and it usually goes away within several days. Dysphoria that lasts longer than several days may be a symptom of a mental disorder, such as major depression or bipolar disorder. Follow these instructions at home: Monitor your mood for any changes. Take these steps to help with your discomfort and unpleasant feelings:  Take over-the-counter and prescription medicines only as told by your health care provider.  Check with  your health care provider before taking any herbs or supplements.  Keep all follow-up visits as told by your health care provider.This is important.  Maintain a healthy lifestyle.  Eat a healthy diet.  Exercise regularly.  Get plenty of sleep.  Avoid alcohol and drugs.  Learn ways to reduce stress and cope with stress, such as with yoga and meditation.  Talk about  your feelings with family members or health care providers.  Make time for yourself to do things that you enjoy. Contact a health care provider if:  You were given medicine and it does not seem to be helping.  You feel hopeless and overwhelmed.  You feel like you cannot leave your house.  You have trouble taking care of yourself. Get help right away if:  You have serious thoughts about hurting yourself or others. This information is not intended to replace advice given to you by your health care provider. Make sure you discuss any questions you have with your health care provider. Document Released: 06/23/2005 Document Revised: 06/20/2015 Document Reviewed: 11/25/2013 Elsevier Interactive Patient Education  2017 [employer].

## 2016-06-04 DIAGNOSIS — F4321 Adjustment disorder with depressed mood: Secondary | ICD-10-CM | POA: Insufficient documentation

## 2016-06-04 DIAGNOSIS — R030 Elevated blood-pressure reading, without diagnosis of hypertension: Secondary | ICD-10-CM | POA: Insufficient documentation

## 2016-06-04 DIAGNOSIS — R011 Cardiac murmur, unspecified: Secondary | ICD-10-CM

## 2016-06-04 DIAGNOSIS — Z7689 Persons encountering health services in other specified circumstances: Secondary | ICD-10-CM | POA: Insufficient documentation

## 2016-06-04 HISTORY — DX: Cardiac murmur, unspecified: R01.1

## 2016-06-04 LAB — CBC
HCT: 39.5 % (ref 35.0–45.0)
HEMOGLOBIN: 12.3 g/dL (ref 11.7–15.5)
MCH: 24.3 pg — ABNORMAL LOW (ref 27.0–33.0)
MCHC: 31.1 g/dL — ABNORMAL LOW (ref 32.0–36.0)
MCV: 77.9 fL — AB (ref 80.0–100.0)
MPV: 10.6 fL (ref 7.5–12.5)
Platelets: 370 10*3/uL (ref 140–400)
RBC: 5.07 MIL/uL (ref 3.80–5.10)
RDW: 16.1 % — ABNORMAL HIGH (ref 11.0–15.0)
WBC: 10.4 10*3/uL (ref 3.8–10.8)

## 2016-06-04 LAB — COMPREHENSIVE METABOLIC PANEL
ALT: 13 U/L (ref 6–29)
AST: 16 U/L (ref 10–35)
Albumin: 3.9 g/dL (ref 3.6–5.1)
Alkaline Phosphatase: 42 U/L (ref 33–115)
BILIRUBIN TOTAL: 0.2 mg/dL (ref 0.2–1.2)
BUN: 11 mg/dL (ref 7–25)
CALCIUM: 9.3 mg/dL (ref 8.6–10.2)
CO2: 23 mmol/L (ref 20–31)
Chloride: 105 mmol/L (ref 98–110)
Creat: 0.65 mg/dL (ref 0.50–1.10)
Glucose, Bld: 89 mg/dL (ref 65–99)
POTASSIUM: 4.5 mmol/L (ref 3.5–5.3)
Sodium: 140 mmol/L (ref 135–146)
TOTAL PROTEIN: 6.2 g/dL (ref 6.1–8.1)

## 2016-06-04 LAB — LIPID PANEL W/REFLEX DIRECT LDL
CHOL/HDL RATIO: 3.3 ratio (ref <5.0)
CHOLESTEROL: 186 mg/dL (ref <200)
HDL: 56 mg/dL (ref >50)
LDL-Cholesterol: 107 mg/dL — ABNORMAL HIGH
Non-HDL Cholesterol (Calc): 130 mg/dL — ABNORMAL HIGH (ref <130)
Triglycerides: 120 mg/dL (ref <150)

## 2016-06-04 LAB — TSH: TSH: 1.25 m[IU]/L

## 2016-06-04 LAB — VITAMIN D 25 HYDROXY (VIT D DEFICIENCY, FRACTURES): Vit D, 25-Hydroxy: 31 ng/mL (ref 30–100)

## 2016-06-10 ENCOUNTER — Other Ambulatory Visit (HOSPITAL_BASED_OUTPATIENT_CLINIC_OR_DEPARTMENT_OTHER): Payer: 59

## 2016-06-16 ENCOUNTER — Ambulatory Visit (INDEPENDENT_AMBULATORY_CARE_PROVIDER_SITE_OTHER): Payer: 59 | Admitting: Licensed Clinical Social Worker

## 2016-06-16 ENCOUNTER — Encounter (HOSPITAL_COMMUNITY): Payer: Self-pay | Admitting: Licensed Clinical Social Worker

## 2016-06-16 DIAGNOSIS — F411 Generalized anxiety disorder: Secondary | ICD-10-CM

## 2016-06-16 DIAGNOSIS — F4323 Adjustment disorder with mixed anxiety and depressed mood: Secondary | ICD-10-CM | POA: Diagnosis not present

## 2016-06-16 DIAGNOSIS — F331 Major depressive disorder, recurrent, moderate: Secondary | ICD-10-CM
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
## 2016-06-17 ENCOUNTER — Encounter: Payer: Self-pay | Admitting: Physician Assistant

## 2016-06-17 ENCOUNTER — Ambulatory Visit (HOSPITAL_BASED_OUTPATIENT_CLINIC_OR_DEPARTMENT_OTHER)
Admission: RE | Admit: 2016-06-17 | Discharge: 2016-06-17 | Disposition: A | Discharge status: Home / Self Care | Payer: 59 | Source: Ambulatory Visit | Attending: Physician Assistant | Admitting: Physician Assistant

## 2016-06-17 DIAGNOSIS — I359 Nonrheumatic aortic valve disorder, unspecified: Secondary | ICD-10-CM | POA: Insufficient documentation

## 2016-06-17 DIAGNOSIS — I517 Cardiomegaly: Secondary | ICD-10-CM | POA: Insufficient documentation

## 2016-06-17 DIAGNOSIS — R011 Cardiac murmur, unspecified: Secondary | ICD-10-CM | POA: Diagnosis present

## 2016-06-17 HISTORY — DX: Cardiomegaly: I51.7

## 2016-06-17 HISTORY — DX: Nonrheumatic aortic valve disorder, unspecified: I35.9

## 2016-06-17 NOTE — Progress Notes (Signed)
  Echocardiogram 2D Echocardiogram has been performed.  Janet Wheeler, Janet Wheeler 06/17/2016, 11:05 AM

## 2016-07-01 ENCOUNTER — Ambulatory Visit (INDEPENDENT_AMBULATORY_CARE_PROVIDER_SITE_OTHER): Payer: 59 | Admitting: Licensed Clinical Social Worker

## 2016-07-01 ENCOUNTER — Ambulatory Visit (INDEPENDENT_AMBULATORY_CARE_PROVIDER_SITE_OTHER): Payer: 59 | Admitting: Physician Assistant

## 2016-07-01 VITALS — BP 135/88 | HR 67 | Wt 201.0 lb

## 2016-07-01 DIAGNOSIS — F331 Major depressive disorder, recurrent, moderate: Secondary | ICD-10-CM | POA: Diagnosis not present

## 2016-07-01 DIAGNOSIS — Z6833 Body mass index (BMI) 33.0-33.9, adult: Secondary | ICD-10-CM | POA: Diagnosis not present

## 2016-07-01 DIAGNOSIS — F411 Generalized anxiety disorder: Secondary | ICD-10-CM

## 2016-07-01 DIAGNOSIS — F330 Major depressive disorder, recurrent, mild: Secondary | ICD-10-CM | POA: Diagnosis not present

## 2016-07-01 DIAGNOSIS — F4323 Adjustment disorder with mixed anxiety and depressed mood: Secondary | ICD-10-CM | POA: Diagnosis not present

## 2016-07-01 DIAGNOSIS — E6609 Other obesity due to excess calories: Secondary | ICD-10-CM | POA: Diagnosis not present

## 2016-07-01 DIAGNOSIS — I10 Essential (primary) hypertension: Secondary | ICD-10-CM | POA: Diagnosis not present

## 2016-07-01 MED ORDER — VALSARTAN 40 MG PO TABS: 80.0000 mg | ORAL_TABLET | Freq: Every day | ORAL | 1 refills | Status: DC
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
## 2016-07-01 NOTE — Patient Instructions (Addendum)
For your blood pressure: - Start Valsartan 40mg  (1 tablet) every morning (the bottle will say to take 2, but we are going to start at 1 tablet) - Limit salt. Follow DASH eating plan - Increase physical activity and cardiovascular exercise - Avoid tobacco products - Limit alcohol to 1 drink per week - Follow-up in 2 weeks for a nurse visit BP check  Physical Activity Recommendations for modifying lipids and lowering blood pressure Engage in aerobic physical activity to reduce LDL-cholesterol, non-HDL-cholesterol, and blood pressure  Frequency: 3-4 sessions per week  Intensity: moderate to vigorous  Duration: 40 minutes on average  Physical Activity Recommendations for secondary prevention 1. Aerobic exercise  Frequency: 3-5 sessions per week  Intensity: 50-80% capacity  Duration: 20 - 60 minutes  Examples: walking, treadmill, cycling, rowing, stair climbing, and arm/leg ergometry  2. Resistance exercise  Frequency: 2-3 sessions per week  Intensity: 10-15 repetitions/set to moderate fatigue  Duration: 1-3 sets of 8-10 upper and lower body exercises  Examples: calisthenics, elastic bands, cuff/hand weights, dumbbels, free weights, wall pulleys, and weight machines  Hypertension Hypertension, commonly called high blood pressure, is when the force of blood pumping through the arteries is too strong. The arteries are the blood vessels that carry blood from the heart throughout the body. Hypertension forces the heart to work harder to pump blood and may cause arteries to become narrow or stiff. Having untreated or uncontrolled hypertension can cause heart attacks, strokes, kidney disease, and other problems. A blood pressure reading consists of a higher number over a lower number. Ideally, your blood pressure should be below 120/80. The first ("top") number is called the systolic pressure. It is a measure of the pressure in your arteries as your heart beats. The second ("bottom")  number is called the diastolic pressure. It is a measure of the pressure in your arteries as the heart relaxes. What are the causes? The cause of this condition is not known. What increases the risk? Some risk factors for high blood pressure are under your control. Others are not. Factors you can change  Smoking.  Having type 2 diabetes mellitus, high cholesterol, or both.  Not getting enough exercise or physical activity.  Being overweight.  Having too much fat, sugar, calories, or salt (sodium) in your diet.  Drinking too much alcohol. Factors that are difficult or impossible to change  Having chronic kidney disease.  Having a family history of high blood pressure.  Age. Risk increases with age.  Race. You may be at higher risk if you are African-American.  Gender. Men are at higher risk than women before age 47. After age 47, women are at higher risk than men.  Having obstructive sleep apnea.  Stress. What are the signs or symptoms? Extremely high blood pressure (hypertensive crisis) may cause:  Headache.  Anxiety.  Shortness of breath.  Nosebleed.  Nausea and vomiting.  Severe chest pain.  Jerky movements you cannot control (seizures).  How is this diagnosed? This condition is diagnosed by measuring your blood pressure while you are seated, with your arm resting on a surface. The cuff of the blood pressure monitor will be placed directly against the skin of your upper arm at the level of your heart. It should be measured at least twice using the same arm. Certain conditions can cause a difference in blood pressure between your right and left arms. Certain factors can cause blood pressure readings to be lower or higher than normal (elevated) for a short period  of time:  When your blood pressure is higher when you are in a health care provider's office than when you are at home, this is called white coat hypertension. Most people with this condition do not need  medicines.  When your blood pressure is higher at home than when you are in a health care provider's office, this is called masked hypertension. Most people with this condition may need medicines to control blood pressure.  If you have a high blood pressure reading during one visit or you have normal blood pressure with other risk factors:  You may be asked to return on a different day to have your blood pressure checked again.  You may be asked to monitor your blood pressure at home for 1 week or longer.  If you are diagnosed with hypertension, you may have other blood or imaging tests to help your health care provider understand your overall risk for other conditions. How is this treated? This condition is treated by making healthy lifestyle changes, such as eating healthy foods, exercising more, and reducing your alcohol intake. Your health care provider may prescribe medicine if lifestyle changes are not enough to get your blood pressure under control, and if:  Your systolic blood pressure is above 130.  Your diastolic blood pressure is above 80.  Your personal target blood pressure may vary depending on your medical conditions, your age, and other factors. Follow these instructions at home: Eating and drinking  Eat a diet that is high in fiber and potassium, and low in sodium, added sugar, and fat. An example eating plan is called the DASH (Dietary Approaches to Stop Hypertension) diet. To eat this way: ? Eat plenty of fresh fruits and vegetables. Try to fill half of your plate at each meal with fruits and vegetables. ? Eat whole grains, such as whole wheat pasta, brown rice, or whole grain bread. Fill about one quarter of your plate with whole grains. ? Eat or drink low-fat dairy products, such as skim milk or low-fat yogurt. ? Avoid fatty cuts of meat, processed or cured meats, and poultry with skin. Fill about one quarter of your plate with lean proteins, such as fish, chicken  without skin, beans, eggs, and tofu. ? Avoid premade and processed foods. These tend to be higher in sodium, added sugar, and fat.  Reduce your daily sodium intake. Most people with hypertension should eat less than 1,500 mg of sodium a day.  Limit alcohol intake to no more than 1 drink a day for nonpregnant women and 2 drinks a day for men. One drink equals 12 oz of beer, 5 oz of wine, or 1 oz of hard liquor. Lifestyle  Work with your health care provider to maintain a healthy body weight or to lose weight. Ask what an ideal weight is for you.  Get at least 30 minutes of exercise that causes your heart to beat faster (aerobic exercise) most days of the week. Activities may include walking, swimming, or biking.  Include exercise to strengthen your muscles (resistance exercise), such as pilates or lifting weights, as part of your weekly exercise routine. Try to do these types of exercises for 30 minutes at least 3 days a week.  Do not use any products that contain nicotine or tobacco, such as cigarettes and e-cigarettes. If you need help quitting, ask your health care provider.  Monitor your blood pressure at home as told by your health care provider.  Keep all follow-up visits as told by  your health care provider. This is important. Medicines  Take over-the-counter and prescription medicines only as told by your health care provider. Follow directions carefully. Blood pressure medicines must be taken as prescribed.  Do not skip doses of blood pressure medicine. Doing this puts you at risk for problems and can make the medicine less effective.  Ask your health care provider about side effects or reactions to medicines that you should watch for. Contact a health care provider if:  You think you are having a reaction to a medicine you are taking.  You have headaches that keep coming back (recurring).  You feel dizzy.  You have swelling in your ankles.  You have trouble with your  vision. Get help right away if:  You develop a severe headache or confusion.  You have unusual weakness or numbness.  You feel faint.  You have severe pain in your chest or abdomen.  You vomit repeatedly.  You have trouble breathing. Summary  Hypertension is when the force of blood pumping through your arteries is too strong. If this condition is not controlled, it may put you at risk for serious complications.  Your personal target blood pressure may vary depending on your medical conditions, your age, and other factors. For most people, a normal blood pressure is less than 120/80.  Hypertension is treated with lifestyle changes, medicines, or a combination of both. Lifestyle changes include weight loss, eating a healthy, low-sodium diet, exercising more, and limiting alcohol. This information is not intended to replace advice given to you by your health care provider. Make sure you discuss any questions you have with your health care provider. Document Released: 01/12/2005 Document Revised: 12/11/2015 Document Reviewed: 12/11/2015 Elsevier Interactive Patient Education  [employer].

## 2016-07-01 NOTE — Progress Notes (Signed)
HPI:                                                                Janet Wheeler is a 46 y.o. female who presents to [facility] today for blood pressure / depression follow-up  Patient with PMH of HTN, not currently on antihypertensive therapy, presents for blood pressure follow-up. Has been monitoring BP's at home for the last 2 weeks. BP range 127-149/85-104.  Denies vision change, headache, chest pain with exertion, orthopnea, lightheadedness, syncope and edema. She recently had an echocardiogram which showed mild LVH and aortic valve calcification.  Depression: taking Zoloft without difficulty. She is also seeing counseling in [facility] downstairs; she has her 2nd appointment today. She states she feels like she is doing better. She states she is able to get about 6 hours of restful sleep with Trazodone 75-100mg . Denies symptoms of mania/hypomania. Denies suicidal thinking. Denies auditory/visual hallucinations.    Past Medical History:  Diagnosis Date  . Anxiety   . Aortic valve calcification 06/17/2016   Mild (Echo 05/2016)  . 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 Hypertension    Pt states that she was previously diagnosed with HTN and perscribed meds but she doesnt take meds anymore and claims to no thave a problem with it anymore  . Iron deficiency anemia 07/27/2014   S/p gastric by pass   . Mild concentric left ventricular hypertrophy (LVH) 06/17/2016  . Obesity   . Systolic ejection murmur 06/04/2016  . Vitamin D deficiency 07/27/2014   S/p gastric by pass    Past Surgical History:  Procedure Laterality Date  . GASTRIC BYPASS  2012   Social History  Substance Use Topics  . Smoking status: Never Smoker  . Smokeless tobacco: Never Used  . Alcohol use Yes     Comment: wine occasionally/ once a week   family history includes Breast cancer in her other; Cancer in her father and sister; Diabetes in her father and mother; Heart disease in  her father, mother, and sister; Hyperlipidemia in her brother and mother; Hypertension in her brother and mother; Stroke in her mother.  ROS: negative except as noted in the HPI  Medications: Current Outpatient Prescriptions  Medication Sig Dispense Refill  . Biotin 10 MG CAPS Take by mouth daily.    . Calcium-Vitamin D-Vitamin K (CALCIUM + D) [phone] MG-UNT-MCG CHEW Chew by mouth 2 (two) times daily.     . Cyanocobalamin (VITAMIN B 12 PO) Take 1,000 mg by mouth.    . etonogestrel-ethinyl estradiol (NUVARING) 0.12-0.015 MG/24HR vaginal ring Place 1 each vaginally every 28 (twenty-eight) days. Insert vaginally and leave in place for 3 consecutive weeks, then remove for 1 week. 1 each 6  . Ferrous Gluconate-C-Folic Acid (IRON-C PO) Take 30 mg by mouth daily.    . Multiple Vitamins-Minerals (MULTIVITAMIN WITH MINERALS) tablet Take 1 tablet by mouth daily.    . sertraline (ZOLOFT) 25 MG tablet Take 1 tablet (25 mg total) by mouth daily. 30 tablet 3  . traZODone (DESYREL) 50 MG tablet Take 1 tablet (50 mg total) by mouth at bedtime. 30 tablet 3  . valsartan (DIOVAN) 40 MG tablet Take 2 tablets (80 mg total) by mouth daily. 60 tablet 1  No current facility-administered medications for this visit.    Allergies  Allergen Reactions  . Sulfonamide Derivatives        Objective:  BP 135/88   Pulse 67   Wt 201 lb (91.2 kg)   BMI 33.45 kg/m  Gen: well-groomed, cooperative, not ill-appearing, no distress Pulm: Normal work of breathing, normal phonation, clear to auscultation bilaterally CV: Normal rate, regular rhythm, s1 and s2 distinct, grade II/VI systolic ejection murmur at the RUSB, clicks or rubs  Neuro: alert and oriented x 3, EOM's intact, no tremor MSK: moving all extremities, normal gait and station, no peripheral edema Psych: good eye contact, normal affect, euthymic mood, normal speech and thought content   Depression screen [facility] 2/9 07/01/2016 06/03/2016  Decreased Interest 1  2  Down, Depressed, Hopeless 1 2  PHQ - 2 Score 2 4  Altered sleeping 1 3  Tired, decreased energy 0 1  Change in appetite 1 1  Feeling bad or failure about yourself  1 3  Trouble concentrating 2 2  Moving slowly or fidgety/restless 0 0  Suicidal thoughts 0 1  PHQ-9 Score 7 15   GAD 7 : Generalized Anxiety Score 07/01/2016  Nervous, Anxious, on Edge 2  Control/stop worrying 1  Worry too much - different things 1  Trouble relaxing 2  Restless 0  Easily annoyed or irritable 0  Afraid - awful might happen 1  Total GAD 7 Score 7     No results found for this or any previous visit (from the past 72 hour(s)). No results found.    Assessment and Plan: 46 y.o. female with   1. Hypertension goal BP (blood pressure) < 130/80 - valsartan (DIOVAN) 40 MG tablet; Take 2 tablets (80 mg total) by mouth daily.  Dispense: 60 tablet; Refill: 1 - therapeutic lifestyle changes - follow-up in 2 weeks for nurse visit BP check  2. Mild episode of recurrent major depressive disorder (HCC) - PHQ9 score 7, improved from 15 since starting SSRI therapy - cont Zoloft 25mg  daily - cont Trazodone 75-100mg  QHS prn  - cont to follow-up with counselor Audree BaneQ2weeks - follow-up in 3 months  Patient education and anticipatory guidance given Patient agrees with treatment plan Follow-up in 2 weeks or sooner as needed if symptoms worsen or fail to improve  Levonne Hubertharley E. Wyolene Weimann PA-C

## 2016-07-02 ENCOUNTER — Encounter: Payer: Self-pay | Admitting: Physician Assistant

## 2016-07-02 DIAGNOSIS — E6609 Other obesity due to excess calories: Secondary | ICD-10-CM | POA: Insufficient documentation

## 2016-07-02 DIAGNOSIS — Z6833 Body mass index (BMI) 33.0-33.9, adult: Secondary | ICD-10-CM

## 2016-07-15 ENCOUNTER — Ambulatory Visit (INDEPENDENT_AMBULATORY_CARE_PROVIDER_SITE_OTHER): Payer: 59 | Admitting: Physician Assistant

## 2016-07-15 VITALS — BP 130/78 | HR 75 | Ht 66.14 in | Wt 199.0 lb

## 2016-07-15 DIAGNOSIS — I10 Essential (primary) hypertension: Secondary | ICD-10-CM | POA: Diagnosis not present

## 2016-07-15 NOTE — Progress Notes (Signed)
Pt reports that she has not had any side effects on the medication. She is doing well.  She asked about interactions of alcohol with taking this medication. She stated that over the weekend she had a mixed beverage and she experienced a "warm" sensation all over and wondered if this was due to the medication. I advised that she should consume in moderation she stated that she really can only consume about 1 drink anyway.   Advised to keep f/u appt in September or if she has any questions or concerns to call office to discuss or make an appointment before then. Pt voiced understanding and agreed.Loralee PacasBarkley, Jodene Polyak [place]

## 2016-07-17 ENCOUNTER — Ambulatory Visit (HOSPITAL_COMMUNITY): Payer: Self-pay | Admitting: Licensed Clinical Social Worker

## 2016-07-23 ENCOUNTER — Ambulatory Visit (INDEPENDENT_AMBULATORY_CARE_PROVIDER_SITE_OTHER): Payer: 59 | Admitting: Licensed Clinical Social Worker

## 2016-07-23 DIAGNOSIS — F4323 Adjustment disorder with mixed anxiety and depressed mood: Secondary | ICD-10-CM

## 2016-07-23 DIAGNOSIS — F331 Major depressive disorder, recurrent, moderate: Secondary | ICD-10-CM

## 2016-07-23 DIAGNOSIS — F411 Generalized anxiety disorder: Secondary | ICD-10-CM | POA: Diagnosis not present

## 2016-07-23 NOTE — Progress Notes (Signed)
   THERAPIST PROGRESS NOTE  Session Time: 4:02 PM to 454  Participation Level: Active  Behavioral Response: CasualAlertEuthymic  Type of Therapy: Individual Therapy  Treatment Goals addressed:  patient will learn coping skills to manage trauma related symptoms, mood regulation strategies and effective stress management strategies Interventions: DBT, Solution Focused, Strength-based, Supportive and Other: Emotional regulation strategies  Summary: Janet FontanaJulia Wheeler is a 46 y.o. female who presents with having dealt with trigger of unexpected trigger of hearing her ex's voice and finds herself missing him. Recognizes these are feeling she has to work through related to loss, and does not see herself going back to relationship. Shared that she is is strong, determined and up to her to have a positive day and discussed choices she is making to put herself in positive environments to create situations that will help her move on and give her opportunity to enjoy her life. This includes spending time in locations that she enjoys before moving into a community that where she will have a lot to do and to enjoy. Discussed that grounding has been helpful and that she focuses on things instead of herself and anxiety. She reports that she still has to struggle with anxious thoughts but she also make an effort to put in practice the strategies and have been helpful. She will remind herself in the midst of feeling anxious to just breathe, look at some pretty pictures, and finds these practices help to clear and settle mind. Shares that she discovers that she has a different mindset when pausing and engaging when things around her. Instead of multitasking she focuses on one task and finds herself being more present and savoring the task. Through these strategies she is focusing on herself and discussed with patient how this is helpful to take care of self and overall well-being. Discussed use as well of oils. Recognizes  when she is not using healthy coping strategies, realize that they are not helpful and coping, or crutches that helps her refocused to healthier strategies such as being careful what she eats and being outdoors therapist introduced patient to mindfulness and provided more literature on mindfulness.   Suicidal/Homicidal: No  Therapist Response: Reviewed current symptoms and helped her process through feelings of missing her previous relationship. Identified working through feelings of loss and provided positive feedback for patient's recognition of having to work through these feelings but at the same time moving on with her life. Discussed building coping tools that will help her in managing her anxiety, depression as well as having the resources available when needed for triggers and stressors. Discussed how helpful grounding has been and deep breathing and introduced mindfulness application to help in practice. Provided positive feedback for positive choices she is making and coping and moving forward with her life. Provided strength based and supportive interventions  Plan: Return again in 2 weeks.2. Therapist continued to work with patient on strategies to manage stress, emotional regulation and issues related to trauma and grief  Diagnosis: Axis I: generalized anxiety disorder, major depressive disorder, recurrent, moderate, adjustment disorder with mixed anxiety and depressed mood    Axis II: No diagnosis    Coolidge BreezeMary Kyannah Climer, LCSW 07/23/2016

## 2016-08-14 ENCOUNTER — Ambulatory Visit (HOSPITAL_COMMUNITY): Payer: Self-pay | Admitting: Licensed Clinical Social Worker

## 2016-08-18 DIAGNOSIS — I739 Peripheral vascular disease, unspecified: Secondary | ICD-10-CM | POA: Insufficient documentation

## 2016-08-21 ENCOUNTER — Ambulatory Visit (INDEPENDENT_AMBULATORY_CARE_PROVIDER_SITE_OTHER): Payer: 59 | Admitting: Licensed Clinical Social Worker

## 2016-08-21 DIAGNOSIS — F4323 Adjustment disorder with mixed anxiety and depressed mood: Secondary | ICD-10-CM | POA: Diagnosis not present

## 2016-08-21 DIAGNOSIS — F411 Generalized anxiety disorder: Secondary | ICD-10-CM | POA: Diagnosis not present

## 2016-08-21 DIAGNOSIS — F331 Major depressive disorder, recurrent, moderate: Secondary | ICD-10-CM | POA: Diagnosis not present
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
## 2016-09-07 ENCOUNTER — Other Ambulatory Visit: Payer: Self-pay | Admitting: Physician Assistant

## 2016-09-07 DIAGNOSIS — F4321 Adjustment disorder with depressed mood: Secondary | ICD-10-CM

## 2016-09-09 ENCOUNTER — Telehealth: Payer: Self-pay

## 2016-09-09 MED ORDER — CANDESARTAN CILEXETIL 8 MG PO TABS: 8.0000 mg | ORAL_TABLET | Freq: Every day | ORAL | 1 refills | Status: DC

## 2016-09-09 NOTE — Telephone Encounter (Signed)
[facility] is refusing to refill pt valsartan.  She is requesting that something else be called into the pharmacy. Please advise. -EH/RMA

## 2016-09-09 NOTE — Telephone Encounter (Signed)
Switching to Candesartan 8 mg daily Nurse visit BP check in 2 weeks to ensure BP is at goal <130/80

## 2016-09-21 ENCOUNTER — Ambulatory Visit (HOSPITAL_COMMUNITY): Payer: Self-pay | Admitting: Licensed Clinical Social Worker

## 2016-09-22 ENCOUNTER — Ambulatory Visit (INDEPENDENT_AMBULATORY_CARE_PROVIDER_SITE_OTHER): Payer: 59 | Admitting: Licensed Clinical Social Worker

## 2016-09-22 DIAGNOSIS — F331 Major depressive disorder, recurrent, moderate: Secondary | ICD-10-CM

## 2016-09-22 DIAGNOSIS — F411 Generalized anxiety disorder: Secondary | ICD-10-CM | POA: Diagnosis not present

## 2016-09-22 DIAGNOSIS — F4323 Adjustment disorder with mixed anxiety and depressed mood: Secondary | ICD-10-CM
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
## 2016-09-30 ENCOUNTER — Other Ambulatory Visit: Payer: Self-pay | Admitting: Physician Assistant

## 2016-09-30 DIAGNOSIS — F4321 Adjustment disorder with depressed mood: Secondary | ICD-10-CM

## 2016-10-08 ENCOUNTER — Ambulatory Visit: Payer: Self-pay | Admitting: Physician Assistant

## 2016-10-21 ENCOUNTER — Ambulatory Visit (INDEPENDENT_AMBULATORY_CARE_PROVIDER_SITE_OTHER): Payer: 59 | Admitting: Licensed Clinical Social Worker

## 2016-10-21 DIAGNOSIS — F331 Major depressive disorder, recurrent, moderate: Secondary | ICD-10-CM | POA: Diagnosis not present

## 2016-10-21 DIAGNOSIS — F4323 Adjustment disorder with mixed anxiety and depressed mood: Secondary | ICD-10-CM | POA: Diagnosis not present

## 2016-10-21 DIAGNOSIS — F411 Generalized anxiety disorder: Secondary | ICD-10-CM
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
## 2016-10-23 ENCOUNTER — Encounter: Payer: Self-pay | Admitting: Physician Assistant

## 2016-10-23 ENCOUNTER — Ambulatory Visit (INDEPENDENT_AMBULATORY_CARE_PROVIDER_SITE_OTHER): Payer: 59 | Admitting: Physician Assistant

## 2016-10-23 VITALS — BP 121/80 | HR 67 | Wt 200.0 lb

## 2016-10-23 DIAGNOSIS — F3341 Major depressive disorder, recurrent, in partial remission: Secondary | ICD-10-CM | POA: Diagnosis not present

## 2016-10-23 DIAGNOSIS — Z23 Encounter for immunization: Secondary | ICD-10-CM

## 2016-10-23 DIAGNOSIS — Z79899 Other long term (current) drug therapy: Secondary | ICD-10-CM | POA: Diagnosis not present

## 2016-10-23 DIAGNOSIS — I10 Essential (primary) hypertension: Secondary | ICD-10-CM

## 2016-10-23 DIAGNOSIS — F411 Generalized anxiety disorder: Secondary | ICD-10-CM | POA: Diagnosis not present

## 2016-10-23 MED ORDER — CANDESARTAN CILEXETIL 8 MG PO TABS: 8.0000 mg | ORAL_TABLET | Freq: Every day | ORAL | 1 refills | Status: DC

## 2016-10-23 MED ORDER — TRAZODONE HCL 50 MG PO TABS: 75.0000 mg | ORAL_TABLET | Freq: Every evening | ORAL | 1 refills | Status: DC | PRN

## 2016-10-23 MED ORDER — SERTRALINE HCL 25 MG PO TABS: 25.0000 mg | ORAL_TABLET | Freq: Every day | ORAL | 1 refills | Status: DC

## 2016-10-23 NOTE — Patient Instructions (Addendum)
For your blood pressure: - Start baby aspirin 81 mg to help prevent heart attack/stroke - Limit salt to <2000 mg/day - Follow DASH eating plan - limit alcohol to 1 standard drinks per day - avoid tobacco products - weight loss: 7% of current body weight - Follow-up in 6 months

## 2016-10-23 NOTE — Progress Notes (Signed)
HPI:                                                                Janet Wheeler is a 46 y.o. female who presents to [facility]: Primary Care [facility] Medicine today for anxiety / depression follow-up  HTN: taking Candesartan daily. Compliant with medications. Denies vision change, headache, chest pain with exertion, orthopnea, lightheadedness, syncope and edema. She was recently evaluated in the ED for right thumb discoloration and numbness, which was thought to be ischemic. CT angio was negative. Symptoms have resolved. Risk factors include: obesity, family hx  Depression/Anxiety: taking Sertraline without difficulty. Reports she is doing well; denies depressed mood or anhedonia. She is going to therapy once a month. She is taking Trazodone 1.5 tabs as needed for sleep. Denies symptoms of mania/hypomania. Denies suicidal thinking. Denies auditory/visual hallucinations.   Past Medical History:  Diagnosis Date  . Anxiety   . Aortic valve calcification 06/17/2016   Mild (Echo 05/2016)  . 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 Hypertension    Pt states that she was previously diagnosed with HTN and perscribed meds but she doesnt take meds anymore and claims to no thave a problem with it anymore  . Iron deficiency anemia 07/27/2014   S/p gastric by pass   . Mild concentric left ventricular hypertrophy (LVH) 06/17/2016  . Obesity   . Systolic ejection murmur 06/04/2016  . Vitamin D deficiency 07/27/2014   S/p gastric by pass    Past Surgical History:  Procedure Laterality Date  . GASTRIC BYPASS  2012   Social History  Substance Use Topics  . Smoking status: Never Smoker  . Smokeless tobacco: Never Used  . Alcohol use Yes     Comment: wine occasionally/ once a week   family history includes Breast cancer in her other; Cancer in her father and sister; Diabetes in her father and mother; Heart disease in her father, mother, and sister; Hyperlipidemia in her brother and  mother; Hypertension in her brother and mother; Stroke in her mother.  ROS: negative except as noted in the HPI  Medications: Current Outpatient Prescriptions  Medication Sig Dispense Refill  . aspirin 81 MG chewable tablet Chew by mouth.    . Biotin 10 MG CAPS Take by mouth daily.    . Calcium-Vitamin D-Vitamin K (CALCIUM + D) [id number] MG-UNT-MCG CHEW Chew by mouth 2 (two) times daily.     . candesartan (ATACAND) 8 MG tablet Take 1 tablet (8 mg total) by mouth daily. 30 tablet 1  . Cyanocobalamin (VITAMIN B 12 PO) Take 1,000 mg by mouth.    . etonogestrel-ethinyl estradiol (NUVARING) 0.12-0.015 MG/24HR vaginal ring Place 1 each vaginally every 28 (twenty-eight) days. Insert vaginally and leave in place for 3 consecutive weeks, then remove for 1 week. 1 each 6  . Ferrous Gluconate-C-Folic Acid (IRON-C PO) Take 30 mg by mouth daily.    . Multiple Vitamins-Minerals (MULTIVITAMIN WITH MINERALS) tablet Take 1 tablet by mouth daily.    . sertraline (ZOLOFT) 25 MG tablet TAKE 1 TABLET(25 MG) BY MOUTH DAILY 30 tablet 0  . traZODone (DESYREL) 50 MG tablet TAKE 1 TABLET(50 MG) BY MOUTH AT BEDTIME 30 tablet 0   No current facility-administered medications for this visit.  Allergies  Allergen Reactions  . Sulfonamide Derivatives        Objective:  BP 121/80   Pulse 67   Wt 200 lb (90.7 kg)   BMI 32.14 kg/m  Gen:  alert, not ill-appearing, no distress, appropriate for age HEENT: head normocephalic without obvious abnormality, conjunctiva and cornea clear, trachea midline Pulm: Normal work of breathing, normal phonation, clear to auscultation bilaterally, no wheezes, rales or rhonchi CV: Normal rate, regular rhythm, s1 and s2 distinct, grade III/VI systolic murmur  Neuro: alert and oriented x 3, no tremor MSK: extremities atraumatic, normal gait and station Skin: intact, no rashes on exposed skin, no jaundice, no cyanosis Psych: well-groomed, cooperative, good eye contact, euthymic  mood, affect mood-congruent, speech is articulate, and thought processes clear and goal-directed   No results found for this or any previous visit (from the past 72 hour(s)). No results found.  Depression screen [facility] 2/9 10/23/2016 07/01/2016 06/03/2016  Decreased Interest 0 1 2  Down, Depressed, Hopeless 0 1 2  PHQ - 2 Score 0 2 4  Altered sleeping Tired, decreased energy 0 0 1  Change in appetite Feeling bad or failure about yourself  0 1 3  Trouble concentrating 0 2 2  Moving slowly or fidgety/restless 0 0 0  Suicidal thoughts 0 0 1  PHQ-9 Score Difficult doing work/chores Not difficult at all - -   GAD 7 : Generalized Anxiety Score 10/23/2016 07/01/2016  Nervous, Anxious, on Edge 1 2  Control/stop worrying 1 1  Worry too much - different things 0 1  Trouble relaxing 0 2  Restless 0 0  Easily annoyed or irritable 1 0  Afraid - awful might happen 0 1  Total GAD 7 Score 3 7  Anxiety Difficulty Not difficult at all -      Assessment and Plan: 46 y.o. female with   1. Recurrent major depressive disorder, in partial remission (HCC) - PHQ9 score 2, improved, partial remission. Continue daily meds and CBT. Follow-up in 6 months - sertraline (ZOLOFT) 25 MG tablet; Take 1 tablet (25 mg total) by mouth daily.  Dispense: 90 tablet; Refill: 1 - traZODone (DESYREL) 50 MG tablet; Take 1.5 tablets (75 mg total) by mouth at bedtime as needed for sleep.  Dispense: 135 tablet; Refill: 1  2. GAD (generalized anxiety disorder) - GAD7 score 3, mild. Continue daily meds and CBT. Follow-up in 6 months - sertraline (ZOLOFT) 25 MG tablet; Take 1 tablet (25 mg total) by mouth daily.  Dispense: 90 tablet; Refill: 1 - traZODone (DESYREL) 50 MG tablet; Take 1.5 tablets (75 mg total) by mouth at bedtime as needed for sleep.  Dispense: 135 tablet; Refill: 1  3. Hypertension goal BP (blood pressure) < 130/80 BP Readings from Last 3 Encounters:  10/23/16 121/80  07/15/16 130/78   07/01/16 135/88  - BP in range today - discussed increased risk of blood clots and stroke with Nuvaring. This is prescribed by [facility]. She plans to make an appointment to switch to a LARC - cont Candesartan and Baby ASA - therapeutic lifestyle changes  4. Need for immunization against influenza - Flu Vaccine QUAD 36+ mos IM   Patient education and anticipatory guidance given Patient agrees with treatment plan Follow-up in 6 months or sooner as needed if symptoms worsen or fail to improve  Levonne Hubert PA-C

## 2016-11-17 ENCOUNTER — Ambulatory Visit (INDEPENDENT_AMBULATORY_CARE_PROVIDER_SITE_OTHER): Payer: 59 | Admitting: Licensed Clinical Social Worker

## 2016-11-17 DIAGNOSIS — F3341 Major depressive disorder, recurrent, in partial remission: Secondary | ICD-10-CM

## 2016-11-17 DIAGNOSIS — F411 Generalized anxiety disorder: Secondary | ICD-10-CM
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
## 2016-12-10 IMAGING — MG MM DIGITAL SCREENING
5 series · 5 of 5 positions shown · non-contrast
Comparison: Previous exam(s).

CLINICAL DATA: Screening.

EXAM:
DIGITAL SCREENING BILATERAL MAMMOGRAM WITH CAD

[R CC]
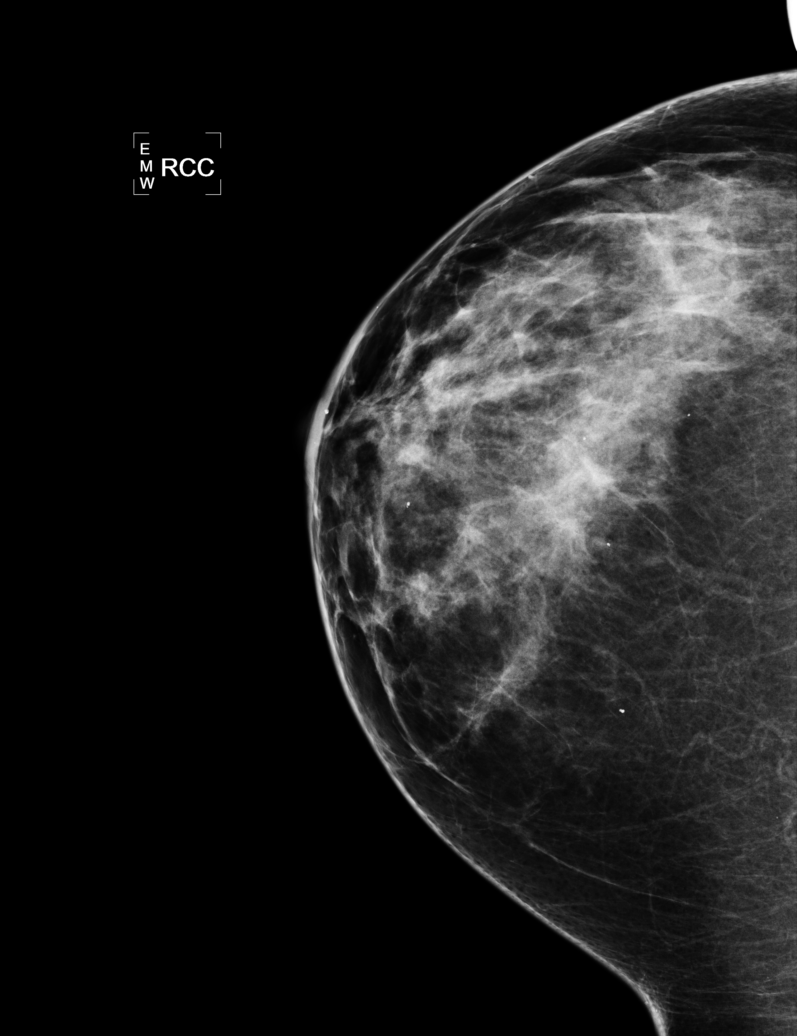

[L CC]
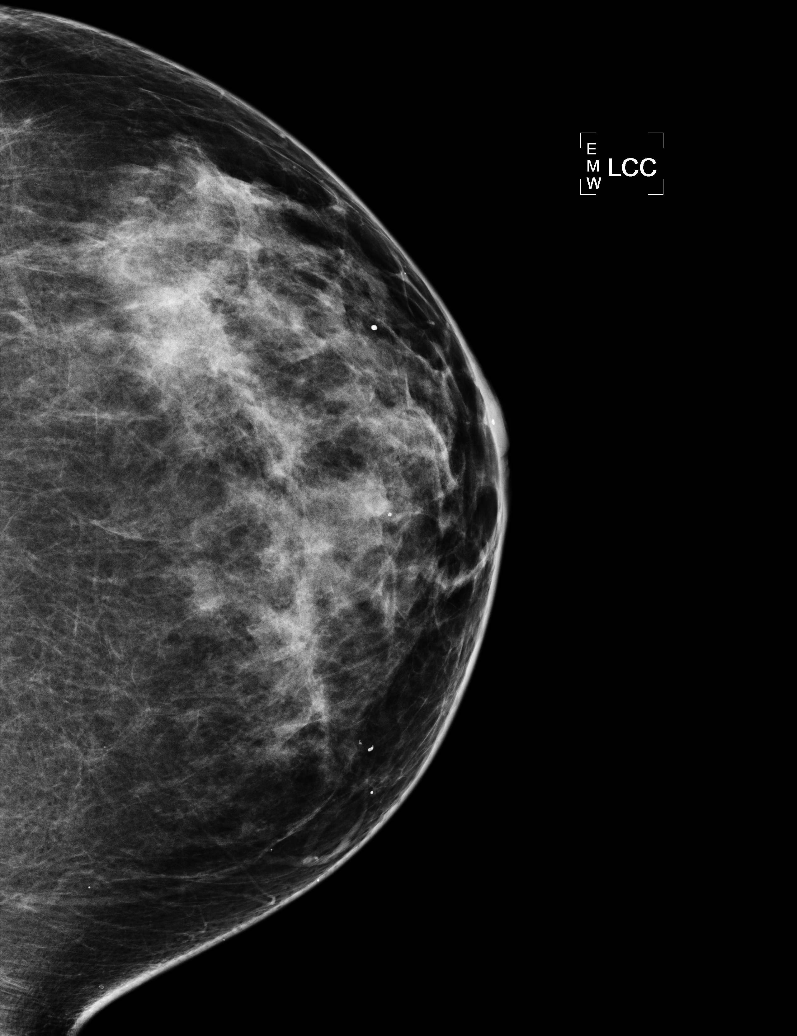

[L MLO]
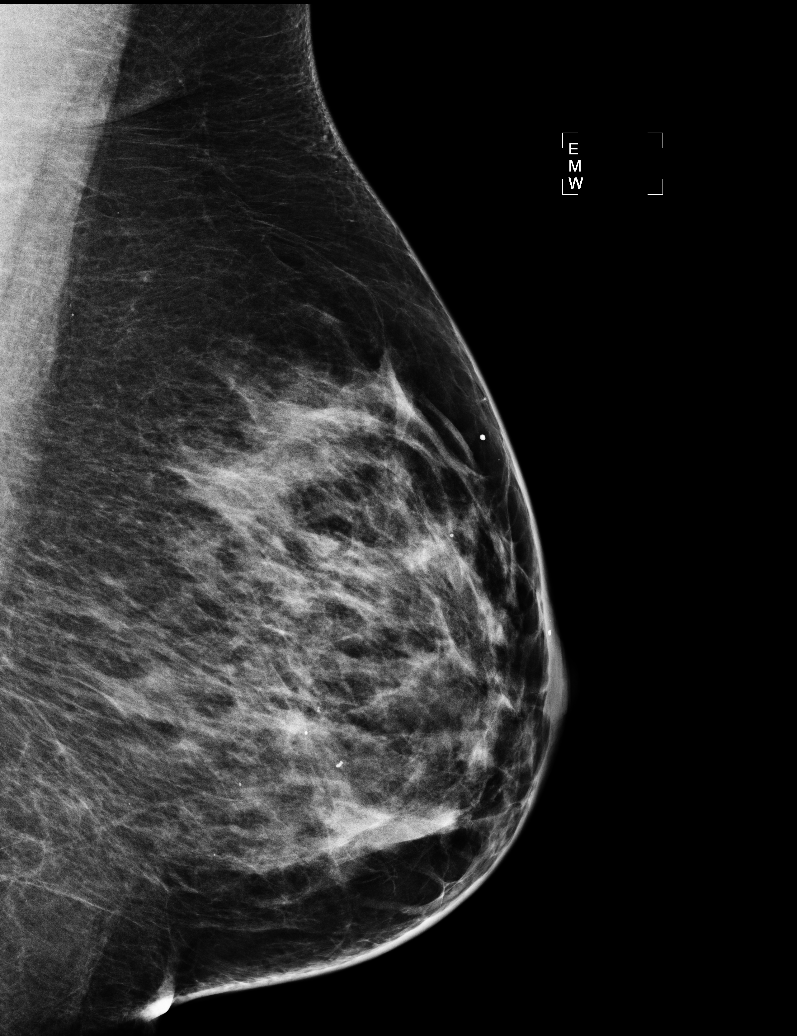

[R MLO]
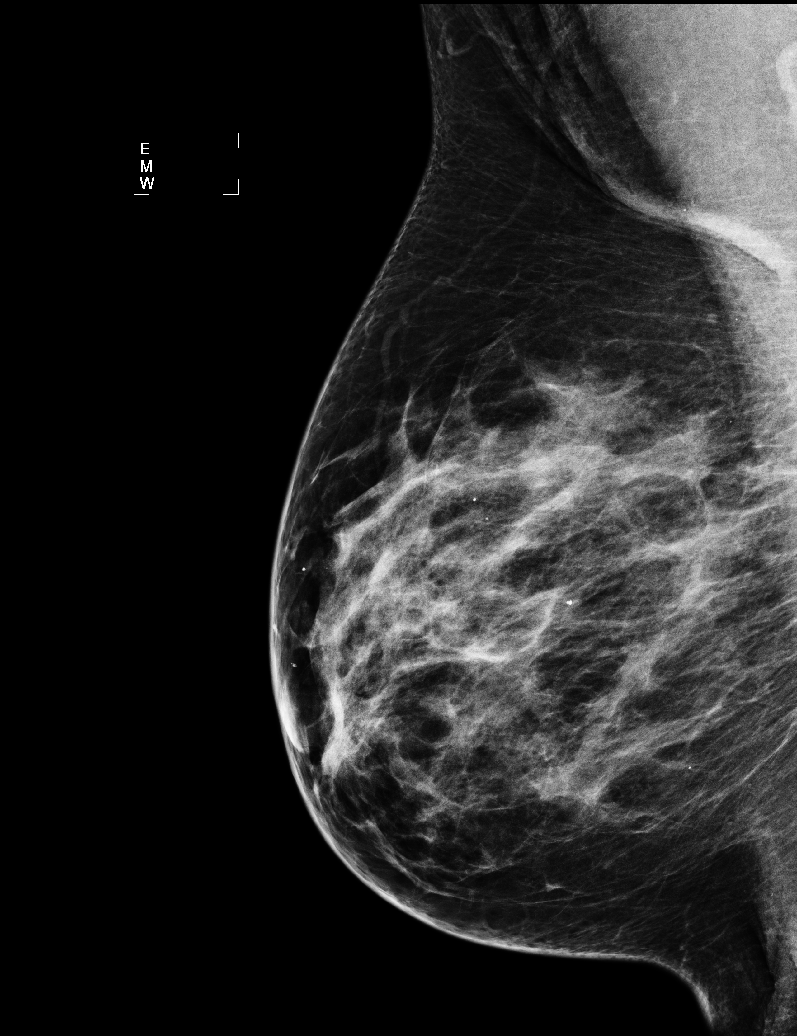

[R XCCL]
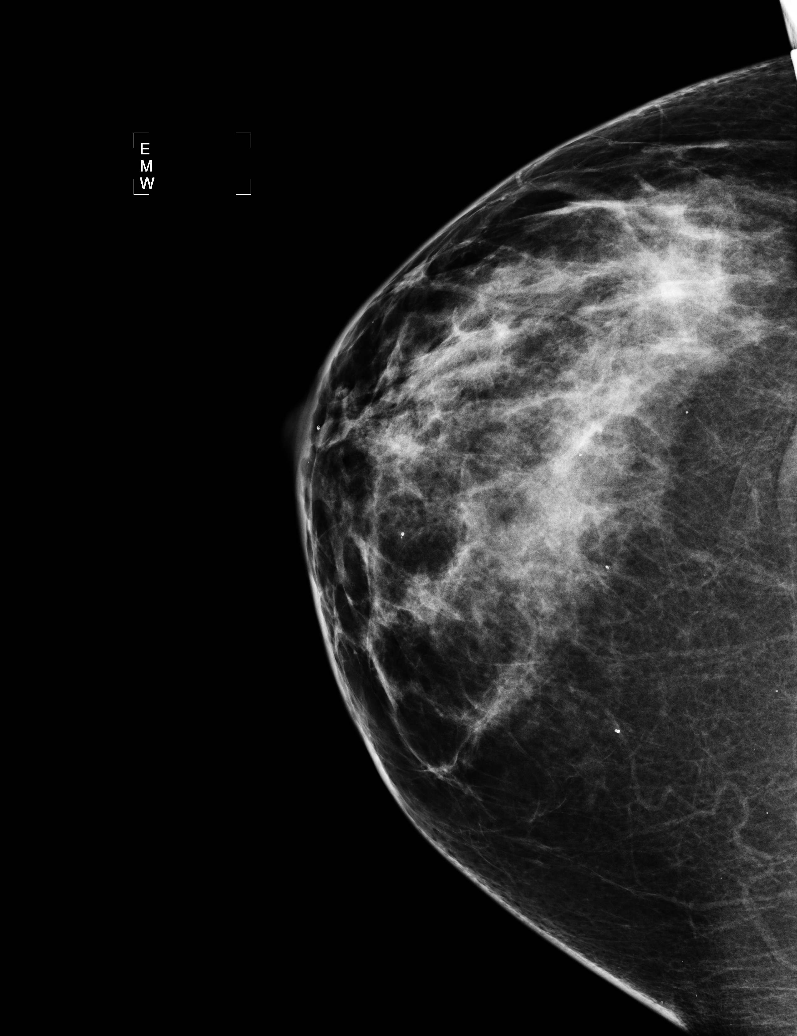

[5 of 5 positions shown; findings below may reference images not displayed]

ACR Breast Density Category c: The breast tissue is heterogeneously
dense, which may obscure small masses.
FINDINGS: There are no findings suspicious for malignancy. Images were
processed with CAD.
IMPRESSION: No mammographic evidence of malignancy. A result letter of this
screening mammogram will be mailed directly to the patient.

RECOMMENDATION:
Screening mammogram in one year. (Code:[id number])

BI-RADS CATEGORY  1: Negative.

## 2016-12-22 ENCOUNTER — Ambulatory Visit (HOSPITAL_COMMUNITY): Payer: Self-pay | Admitting: Licensed Clinical Social Worker

## 2017-04-14 ENCOUNTER — Other Ambulatory Visit: Payer: Self-pay | Admitting: Physician Assistant

## 2017-04-14 DIAGNOSIS — F411 Generalized anxiety disorder: Secondary | ICD-10-CM

## 2017-04-14 DIAGNOSIS — F3341 Major depressive disorder, recurrent, in partial remission: Secondary | ICD-10-CM

## 2017-06-06 ENCOUNTER — Other Ambulatory Visit: Payer: Self-pay | Admitting: Physician Assistant

## 2017-06-06 DIAGNOSIS — F3341 Major depressive disorder, recurrent, in partial remission: Secondary | ICD-10-CM

## 2017-06-06 DIAGNOSIS — F411 Generalized anxiety disorder: Secondary | ICD-10-CM

## 2017-07-08 ENCOUNTER — Other Ambulatory Visit: Payer: Self-pay | Admitting: Physician Assistant

## 2017-07-08 DIAGNOSIS — F411 Generalized anxiety disorder: Secondary | ICD-10-CM

## 2017-07-08 DIAGNOSIS — F3341 Major depressive disorder, recurrent, in partial remission: Secondary | ICD-10-CM

## 2017-07-13 ENCOUNTER — Other Ambulatory Visit: Payer: Self-pay | Admitting: Physician Assistant

## 2017-08-13 ENCOUNTER — Other Ambulatory Visit: Payer: Self-pay | Admitting: Physician Assistant

## 2017-08-20 ENCOUNTER — Ambulatory Visit: Payer: Self-pay | Admitting: Family Medicine

## 2017-08-23 ENCOUNTER — Ambulatory Visit: Payer: 59 | Admitting: Physician Assistant

## 2017-08-23 ENCOUNTER — Encounter: Payer: Self-pay | Admitting: Physician Assistant

## 2017-08-23 VITALS — BP 113/79 | HR 98 | Wt 207.0 lb

## 2017-08-23 DIAGNOSIS — I10 Essential (primary) hypertension: Secondary | ICD-10-CM | POA: Diagnosis not present

## 2017-08-23 DIAGNOSIS — Z131 Encounter for screening for diabetes mellitus: Secondary | ICD-10-CM

## 2017-08-23 DIAGNOSIS — Z5181 Encounter for therapeutic drug level monitoring: Secondary | ICD-10-CM

## 2017-08-23 DIAGNOSIS — F411 Generalized anxiety disorder: Secondary | ICD-10-CM | POA: Diagnosis not present

## 2017-08-23 DIAGNOSIS — F3342 Major depressive disorder, recurrent, in full remission: Secondary | ICD-10-CM

## 2017-08-23 DIAGNOSIS — Z23 Encounter for immunization: Secondary | ICD-10-CM

## 2017-08-23 DIAGNOSIS — R790 Abnormal level of blood mineral: Secondary | ICD-10-CM

## 2017-08-23 DIAGNOSIS — Z130 Encounter for screening for diseases of the blood and blood-forming organs and certain disorders involving the immune mechanism: Secondary | ICD-10-CM

## 2017-08-23 DIAGNOSIS — Z9884 Bariatric surgery status: Secondary | ICD-10-CM | POA: Insufficient documentation

## 2017-08-23 DIAGNOSIS — Z8639 Personal history of other endocrine, nutritional and metabolic disease: Secondary | ICD-10-CM

## 2017-08-23 DIAGNOSIS — Z13220 Encounter for screening for lipoid disorders: Secondary | ICD-10-CM

## 2017-08-23 DIAGNOSIS — D508 Other iron deficiency anemias: Secondary | ICD-10-CM

## 2017-08-23 DIAGNOSIS — E559 Vitamin D deficiency, unspecified: Secondary | ICD-10-CM

## 2017-08-23 DIAGNOSIS — Z1321 Encounter for screening for nutritional disorder: Secondary | ICD-10-CM

## 2017-08-23 MED ORDER — CANDESARTAN CILEXETIL 8 MG PO TABS: 8.0000 mg | ORAL_TABLET | Freq: Every day | ORAL | 1 refills | Status: DC

## 2017-08-23 MED ORDER — TRAZODONE HCL 50 MG PO TABS: ORAL_TABLET | ORAL | 1 refills | Status: AC

## 2017-08-23 NOTE — Patient Instructions (Addendum)
For your blood pressure: - Goal <130/80 - baby aspirin 81 mg daily to help prevent heart attack/stroke - monitor and log blood pressures at home - check around the same time each day in a relaxed setting - Limit salt to <2000 mg/day - Follow DASH eating plan - limit alcohol to 2 standard drinks per day for men and 1 per day for women - avoid tobacco products - weight loss: 7% of current body weight - follow-up every 6 months for your blood pressure   For healthy weight management - 1800 calories/day (to lose 1 pound/week if exercising 4-5 days per week)

## 2017-08-23 NOTE — Addendum Note (Signed)
Addended by: Thom ChimesHENRY, Lithzy Bernard M on: 08/23/2017 09:25 AM   Modules accepted: Orders

## 2017-08-23 NOTE — Progress Notes (Signed)
HPI:                                                                Janet FontanaJulia Wheeler is a 47 y.o. female who [facility] today for medication management  HTN: taking Candesartan daily. Compliant with medications. Has noted increased photosensitivity after being at the pool 5 hours. Denies vision change, headache, chest pain with exertion, orthopnea, lightheadedness, syncope and edema. She was recently evaluated in the ED for right thumb discoloration and numbness, which was thought to be ischemic. CT angio was negative. Symptoms have resolved. Risk factors include: obesity, family hx  Depression/Anxiety: self-discontinued Sertraline because she did not feel that she needed it any longer. Reports she is doing well; denies depressed mood or anhedonia. She is taking Trazodone 1.5 tabs as needed for sleep. [place] no [place] following with the same therapist. Denies symptoms of mania/hypomania. Denies suicidal thinking. Denies auditory/visual hallucinations.   [facility] 08/23/2017 10/23/2016 07/01/2016 06/03/2016  Decreased Interest 0 0 1 2  Down, Depressed, Hopeless 1 0 1 2  PHQ - 2 Score 1 0 2 4  Altered sleeping 1 1 1 3   Tired, decreased energy 0 0 0 1  Change in appetite 0 1 1 1   Feeling bad or failure about yourself  1 0 1 3  Trouble concentrating 0 0 2 2  Moving slowly or fidgety/restless 0 0 0 0  Suicidal thoughts 0 0 0 1  PHQ-9 Score 3 2 7 15   Difficult doing work/chores Not difficult at all Not difficult at all - -    GAD 7 : Generalized Anxiety Score 08/23/2017 10/23/2016 07/01/2016  Nervous, Anxious, on Edge 1 1 2   Control/stop worrying - 1 1  Worry too much - different things 0 0 1  Trouble relaxing 0 0 2  Restless 0 0 0  Easily annoyed or irritable 1 1 0  Afraid - awful might happen 0 0 1  Total GAD 7 Score - 3 7  Anxiety Difficulty Not difficult at all Not difficult at all -       Past Medical History:  Diagnosis Date  . Anxiety   . Aortic valve calcification 06/17/2016   Mild (Echo 05/2016)  . 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 Hypertension    Pt states that she was previously diagnosed with HTN and perscribed meds but she doesnt take meds anymore and claims to no thave a problem with it anymore  . Iron deficiency anemia 07/27/2014   S/p gastric by pass   . Mild concentric left ventricular hypertrophy (LVH) 06/17/2016  . Obesity   . Systolic ejection murmur 06/04/2016  . Vitamin D deficiency 07/27/2014   S/p gastric by pass    Past Surgical History:  Procedure Laterality Date  . GASTRIC BYPASS  2012   Social History   Tobacco Use  . Smoking status: Never Smoker  . Smokeless tobacco: Never Used  Substance Use Topics  . Alcohol use: Yes    Comment: wine occasionally/ once a week   family history includes Breast cancer in her other; Cancer in her father and sister; Diabetes in her father and mother; Heart disease in her father, mother, and  sister; Hyperlipidemia in her brother and mother; Hypertension in her brother and mother; Stroke in her mother.    ROS: negative except as noted in the HPI  Medications: Current Outpatient Medications  Medication Sig Dispense Refill  . Biotin 10 MG CAPS Take by mouth daily.    . Calcium-Vitamin D-Vitamin K (CALCIUM + D) [phone] MG-UNT-MCG CHEW Chew by mouth 2 (two) times daily.     . candesartan (ATACAND) 8 MG tablet Take 1 tablet (8 mg total) by mouth daily. 90 tablet 1  . Cyanocobalamin (VITAMIN B 12 PO) Take 1,000 mg by mouth.    . Ferrous Gluconate-C-Folic Acid (IRON-C PO) Take 30 mg by mouth daily.    . Multiple Vitamins-Minerals (MULTIVITAMIN WITH MINERALS) tablet Take 1 tablet by mouth daily.    . traZODone (DESYREL) 50 MG tablet TAKE 1 AND 1/2 TABLETS(75 MG) BY MOUTH AT BEDTIME AS NEEDED FOR SLEEP 135 tablet 1   No current facility-administered medications for this visit.    Allergies  Allergen  Reactions  . Sulfonamide Derivatives        Objective:  BP 113/79   Pulse 98   Wt 207 lb (93.9 kg)   BMI 33.27 kg/m  Gen:  alert, not ill-appearing, no distress, appropriate for age HEENT: head normocephalic without obvious abnormality, conjunctiva and cornea clear, trachea midline Pulm: Normal work of breathing, normal phonation, clear to auscultation bilaterally, no wheezes, rales or rhonchi CV: Normal rate, regular rhythm, s1 and s2 distinct, no murmurs, clicks or rubs  Neuro: alert and oriented x 3, no tremor MSK: extremities atraumatic, normal gait and station Skin: intact, no rashes on exposed skin, no jaundice, no cyanosis Psych: well-groomed, cooperative, good eye contact, euthymic mood, affect mood-congruent, speech is articulate, and thought processes clear and goal-directed    No results found for this or any previous visit (from the past 72 hour(s)). No results found.    Assessment and Plan: 47 y.o. female with   Medication monitoring encounter - Plan: CBC, Comprehensive metabolic panel, Lipid Panel w/reflex Direct LDL, B12 and Folate Panel  GAD (generalized anxiety disorder)  Recurrent major depressive disorder, in full remission (HCC) - Plan: traZODone (DESYREL) 50 MG tablet  Hypertension goal BP (blood pressure) < 130/80 - Plan: candesartan (ATACAND) 8 MG tablet  Screening for blood disease - Plan: CBC, Fe+TIBC+Fer  Screening for diabetes mellitus - Plan: Comprehensive metabolic panel  Screening for lipid disorders - Plan: Lipid Panel w/reflex Direct LDL  Low ferritin - Plan: Fe+TIBC+Fer  History of vitamin D deficiency - Plan: VITAMIN D 25 Hydroxy (Vit-D Deficiency, Fractures)  Status post gastric bypass for obesity - Plan: B12 and Folate Panel  - BP in range. Cont Candesartan 8 mg daily - counseled on therapeutic lifestyle changes - counseled on weight loss through 1800 calories/day and regular aerobic exercise - fasting labs pending -  depression in remission without Sertraline. Continue therapy prn   Patient education and anticipatory guidance given Patient agrees with treatment plan Follow-up in 6 months or sooner as needed if symptoms worsen or fail to improve  Levonne Hubert PA-C

## 2017-08-24 LAB — COMPREHENSIVE METABOLIC PANEL
AG Ratio: 2.2 (calc) (ref 1.0–2.5)
ALBUMIN MSPROF: 4.3 g/dL (ref 3.6–5.1)
ALT: 13 U/L (ref 6–29)
AST: 13 U/L (ref 10–35)
Alkaline phosphatase (APISO): 45 U/L (ref 33–115)
BUN: 12 mg/dL (ref 7–25)
CO2: 22 mmol/L (ref 20–32)
CREATININE: 0.66 mg/dL (ref 0.50–1.10)
Calcium: 9.6 mg/dL (ref 8.6–10.2)
Chloride: 106 mmol/L (ref 98–110)
Globulin: 2 g/dL (calc) (ref 1.9–3.7)
Glucose, Bld: 98 mg/dL (ref 65–99)
Potassium: 4.6 mmol/L (ref 3.5–5.3)
Sodium: 137 mmol/L (ref 135–146)
TOTAL PROTEIN: 6.3 g/dL (ref 6.1–8.1)
Total Bilirubin: 0.2 mg/dL (ref 0.2–1.2)

## 2017-08-24 LAB — B12 AND FOLATE PANEL
Folate: 6.4 ng/mL
Vitamin B-12: 326 pg/mL (ref 200–1100)

## 2017-08-24 LAB — LIPID PANEL W/REFLEX DIRECT LDL
CHOL/HDL RATIO: 3.1 (calc) (ref <5.0)
CHOLESTEROL: 179 mg/dL (ref <200)
HDL: 57 mg/dL (ref >50)
LDL CHOLESTEROL (CALC): 101 mg/dL — AB
Non-HDL Cholesterol (Calc): 122 mg/dL (calc) (ref <130)
Triglycerides: 111 mg/dL (ref <150)

## 2017-08-24 LAB — CBC
HEMATOCRIT: 30.4 % — AB (ref 35.0–45.0)
Hemoglobin: 8.9 g/dL — ABNORMAL LOW (ref 11.7–15.5)
MCH: 19.6 pg — ABNORMAL LOW (ref 27.0–33.0)
MCHC: 29.3 g/dL — AB (ref 32.0–36.0)
MCV: 67 fL — AB (ref 80.0–100.0)
MPV: 11.2 fL (ref 7.5–12.5)
Platelets: 443 10*3/uL — ABNORMAL HIGH (ref 140–400)
RBC: 4.54 10*6/uL (ref 3.80–5.10)
RDW: 16.6 % — ABNORMAL HIGH (ref 11.0–15.0)
WBC: 8.8 10*3/uL (ref 3.8–10.8)

## 2017-08-24 LAB — IRON,TIBC AND FERRITIN PANEL
%SAT: 3 % (calc) — ABNORMAL LOW (ref 16–45)
Ferritin: 2 ng/mL — ABNORMAL LOW (ref 16–232)
Iron: 16 ug/dL — ABNORMAL LOW (ref 40–190)
TIBC: 488 ug/dL — AB (ref 250–450)

## 2017-08-24 LAB — VITAMIN D 25 HYDROXY (VIT D DEFICIENCY, FRACTURES): VIT D 25 HYDROXY: 24 ng/mL — AB (ref 30–100)

## 2017-08-25 DIAGNOSIS — D649 Anemia, unspecified: Secondary | ICD-10-CM | POA: Insufficient documentation

## 2017-08-25 MED ORDER — VITAMIN D3 75 MCG (3000 UT) PO TABS: 3000.0000 [IU] | ORAL_TABLET | Freq: Every day | ORAL | 1 refills | Status: AC

## 2017-08-25 MED ORDER — FERROUS BISGLYCINATE CHELATE 15 MG PO TABS: 30.0000 mg | ORAL_TABLET | Freq: Two times a day (BID) | ORAL | 1 refills | Status: AC

## 2017-08-25 NOTE — Addendum Note (Signed)
Addended by: Levonne HubertUMMINGS, Lonia Roane E on: 08/25/2017 06:24 PM   Modules accepted: Orders

## 2017-10-25 ENCOUNTER — Other Ambulatory Visit: Payer: Self-pay | Admitting: Physician Assistant

## 2017-10-25 DIAGNOSIS — F4321 Adjustment disorder with depressed mood: Secondary | ICD-10-CM

## 2018-01-28 ENCOUNTER — Ambulatory Visit: Payer: Self-pay | Admitting: Physician Assistant

## 2018-04-02 ENCOUNTER — Other Ambulatory Visit: Payer: Self-pay | Admitting: Physician Assistant

## 2018-04-02 DIAGNOSIS — I10 Essential (primary) hypertension: Secondary | ICD-10-CM
